# Patient Record
Sex: Female | Born: 1940 | Race: White | Hispanic: No | Marital: Married | Smoking: Former smoker
Health system: Southern US, Community
[De-identification: ages and names within clinical notes are randomized; demographics above are authoritative.]

## PROBLEM LIST (undated history)

## (undated) DIAGNOSIS — Z803 Family history of malignant neoplasm of breast: Secondary | ICD-10-CM

## (undated) DIAGNOSIS — F419 Anxiety disorder, unspecified: Secondary | ICD-10-CM

## (undated) DIAGNOSIS — Z923 Personal history of irradiation: Secondary | ICD-10-CM

## (undated) DIAGNOSIS — R195 Other fecal abnormalities: Secondary | ICD-10-CM

## (undated) DIAGNOSIS — I10 Essential (primary) hypertension: Secondary | ICD-10-CM

## (undated) DIAGNOSIS — Z800 Family history of malignant neoplasm of digestive organs: Secondary | ICD-10-CM

## (undated) DIAGNOSIS — N6320 Unspecified lump in the left breast, unspecified quadrant: Principal | ICD-10-CM

## (undated) HISTORY — PX: BREAST SURGERY: SHX581

## (undated) HISTORY — DX: Family history of malignant neoplasm of breast: Z80.3

## (undated) HISTORY — PX: EYE SURGERY: SHX253

## (undated) HISTORY — DX: Other fecal abnormalities: R19.5

## (undated) HISTORY — PX: BREAST CYST EXCISION: SHX579

## (undated) HISTORY — DX: Essential (primary) hypertension: I10

## (undated) HISTORY — DX: Unspecified lump in the left breast, unspecified quadrant: N63.20

## (undated) HISTORY — PX: BREAST EXCISIONAL BIOPSY: SUR124

## (undated) HISTORY — PX: CATARACT EXTRACTION, BILATERAL: SHX1313

## (undated) HISTORY — PX: TONSILLECTOMY: SUR1361

## (undated) HISTORY — DX: Family history of malignant neoplasm of digestive organs: Z80.0

## (undated) HISTORY — PX: MASTECTOMY: SHX3

## (undated) HISTORY — PX: BREAST LUMPECTOMY: SHX2

---

## 2001-10-15 ENCOUNTER — Other Ambulatory Visit: Admission: RE | Admit: 2001-10-15 | Discharge: 2001-10-15 | Payer: Self-pay | Admitting: Obstetrics and Gynecology

## 2002-01-07 ENCOUNTER — Ambulatory Visit (HOSPITAL_COMMUNITY): Admission: RE | Admit: 2002-01-07 | Discharge: 2002-01-07 | Payer: Self-pay | Admitting: Obstetrics and Gynecology

## 2002-01-07 ENCOUNTER — Encounter: Payer: Self-pay | Admitting: Obstetrics and Gynecology

## 2002-10-31 ENCOUNTER — Ambulatory Visit (HOSPITAL_COMMUNITY): Admission: RE | Admit: 2002-10-31 | Discharge: 2002-10-31 | Payer: Self-pay | Admitting: Obstetrics & Gynecology

## 2002-10-31 ENCOUNTER — Encounter: Payer: Self-pay | Admitting: Obstetrics & Gynecology

## 2003-01-08 ENCOUNTER — Ambulatory Visit (HOSPITAL_COMMUNITY): Admission: RE | Admit: 2003-01-08 | Discharge: 2003-01-08 | Payer: Self-pay | Admitting: Obstetrics & Gynecology

## 2003-01-08 ENCOUNTER — Encounter: Payer: Self-pay | Admitting: Obstetrics & Gynecology

## 2004-01-15 ENCOUNTER — Ambulatory Visit (HOSPITAL_COMMUNITY): Admission: RE | Admit: 2004-01-15 | Discharge: 2004-01-15 | Payer: Self-pay | Admitting: Obstetrics & Gynecology

## 2004-09-21 ENCOUNTER — Ambulatory Visit: Payer: Self-pay | Admitting: Internal Medicine

## 2004-09-21 ENCOUNTER — Ambulatory Visit (HOSPITAL_COMMUNITY): Admission: RE | Admit: 2004-09-21 | Discharge: 2004-09-21 | Payer: Self-pay | Admitting: Internal Medicine

## 2005-01-17 ENCOUNTER — Ambulatory Visit (HOSPITAL_COMMUNITY): Admission: RE | Admit: 2005-01-17 | Discharge: 2005-01-17 | Payer: Self-pay | Admitting: Obstetrics & Gynecology

## 2006-01-19 ENCOUNTER — Ambulatory Visit (HOSPITAL_COMMUNITY): Admission: RE | Admit: 2006-01-19 | Discharge: 2006-01-19 | Payer: Self-pay | Admitting: Obstetrics & Gynecology

## 2007-01-22 ENCOUNTER — Ambulatory Visit (HOSPITAL_COMMUNITY): Admission: RE | Admit: 2007-01-22 | Discharge: 2007-01-22 | Payer: Self-pay | Admitting: Obstetrics and Gynecology

## 2007-11-19 ENCOUNTER — Other Ambulatory Visit: Admission: RE | Admit: 2007-11-19 | Discharge: 2007-11-19 | Payer: Self-pay | Admitting: Obstetrics and Gynecology

## 2008-01-23 ENCOUNTER — Ambulatory Visit (HOSPITAL_COMMUNITY): Admission: RE | Admit: 2008-01-23 | Discharge: 2008-01-23 | Payer: Self-pay | Admitting: Obstetrics & Gynecology

## 2008-01-31 ENCOUNTER — Encounter: Admission: RE | Admit: 2008-01-31 | Discharge: 2008-01-31 | Payer: Self-pay | Admitting: Obstetrics & Gynecology

## 2009-02-02 ENCOUNTER — Ambulatory Visit (HOSPITAL_COMMUNITY): Admission: RE | Admit: 2009-02-02 | Discharge: 2009-02-02 | Payer: Self-pay | Admitting: Obstetrics and Gynecology

## 2009-09-11 ENCOUNTER — Ambulatory Visit (HOSPITAL_COMMUNITY): Admission: RE | Admit: 2009-09-11 | Discharge: 2009-09-11 | Payer: Self-pay | Admitting: Internal Medicine

## 2009-10-01 ENCOUNTER — Ambulatory Visit (HOSPITAL_COMMUNITY): Admission: RE | Admit: 2009-10-01 | Discharge: 2009-10-01 | Payer: Self-pay | Admitting: Internal Medicine

## 2010-02-04 ENCOUNTER — Ambulatory Visit (HOSPITAL_COMMUNITY): Admission: RE | Admit: 2010-02-04 | Discharge: 2010-02-04 | Payer: Self-pay | Admitting: Obstetrics and Gynecology

## 2010-12-03 ENCOUNTER — Other Ambulatory Visit: Payer: Self-pay | Admitting: Obstetrics & Gynecology

## 2010-12-03 DIAGNOSIS — Z1239 Encounter for other screening for malignant neoplasm of breast: Secondary | ICD-10-CM

## 2011-02-04 ENCOUNTER — Other Ambulatory Visit (HOSPITAL_COMMUNITY)
Admission: RE | Admit: 2011-02-04 | Discharge: 2011-02-04 | Disposition: A | Discharge status: Home / Self Care | Payer: Medicare Other | Source: Ambulatory Visit | Attending: Obstetrics and Gynecology | Admitting: Obstetrics and Gynecology

## 2011-02-04 ENCOUNTER — Other Ambulatory Visit: Payer: Self-pay | Admitting: Adult Health

## 2011-02-04 DIAGNOSIS — Z124 Encounter for screening for malignant neoplasm of cervix: Secondary | ICD-10-CM | POA: Insufficient documentation

## 2011-02-07 ENCOUNTER — Ambulatory Visit (HOSPITAL_COMMUNITY): Payer: Self-pay

## 2011-02-07 ENCOUNTER — Ambulatory Visit (HOSPITAL_COMMUNITY)
Admission: RE | Admit: 2011-02-07 | Discharge: 2011-02-07 | Disposition: A | Discharge status: Home / Self Care | Payer: Medicare Other | Source: Ambulatory Visit | Attending: Obstetrics & Gynecology | Admitting: Obstetrics & Gynecology

## 2011-02-07 DIAGNOSIS — Z1239 Encounter for other screening for malignant neoplasm of breast: Secondary | ICD-10-CM

## 2011-02-07 DIAGNOSIS — Z1231 Encounter for screening mammogram for malignant neoplasm of breast: Secondary | ICD-10-CM | POA: Insufficient documentation

## 2011-04-01 NOTE — Op Note (Signed)
[name], [name]                ACCOUNT NO.:  [id number]   MEDICAL RECORD NO.:  [id number]          PATIENT TYPE:  AMB   LOCATION:  DAY                           FACILITY:  [facility]   PHYSICIAN:  Lionel December, M.D.    DATE OF BIRTH:  [birth year]   DATE OF PROCEDURE:  09/21/2004  DATE OF DISCHARGE:                                 OPERATIVE REPORT   PROCEDURE:  Total colonoscopy.   INDICATIONS:  [name] is a 70 year old Caucasian female who is undergoing  screening colonoscopy.  She is average-risk for CRC.  The procedure risks  were reviewed with the patient, and informed consent was obtained.   PREMEDICATION:  Demerol 50 mg IV, Versed 8 mg IV in divided dose.   FINDINGS:  Procedure performed in endoscopy suite.  The patient's vital  signs and O2 saturation were monitored during procedure and remained stable.  The patient was placed in the left lateral recumbent position and rectal  examination performed.  No abnormality noted on external or digital exam.  Olympus video scope was placed in rectum and advanced under vision in  sigmoid colon and beyond.  Preparation was excellent except she had some  stool in the cecum, which was washed away.  Cecal landmarks, i.e.,  appendiceal orifice and ileocecal valve, were well-seen and picture taken  for the record.  A short segment of TI was also examined and was normal.  As  the scope was withdrawn, the colonic mucosa was carefully examined.  There  was a 3 mm polyp at the proximal sigmoid colon, which was ablated via cold  biopsy.  Mucosa of the rest of the colon was normal.  The rectal mucosa  similarly was normal.  The scope was retroflexed to examine anorectal  junction, and small hemorrhoids were noted below the dentate line.  The  endoscope was straightened and withdrawn.  The patient tolerated the  procedure well.   FINAL DIAGNOSES:  1.  Small polyp at proximal sigmoid colon, which was ablated via cold      biopsy.  2.  External  hemorrhoids.   RECOMMENDATIONS:  1.  Standard instructions given.  2.  I will be contacting the patient with biopsy results and further      recommendations.     [name]   NR/MEDQ  D:  09/21/2004  T:  09/21/2004  Job:  [id number]   cc:   Lazaro Arms, M.D.  [address], [address]  [place]  [place] [address]  Fax: [phone]   Cyril Mourning  Office of Dr. Despina Hidden

## 2012-01-23 ENCOUNTER — Other Ambulatory Visit: Payer: Self-pay | Admitting: Obstetrics and Gynecology

## 2012-01-23 DIAGNOSIS — Z139 Encounter for screening, unspecified: Secondary | ICD-10-CM

## 2012-02-07 DIAGNOSIS — Z1389 Encounter for screening for other disorder: Secondary | ICD-10-CM | POA: Diagnosis not present

## 2012-02-07 DIAGNOSIS — Z124 Encounter for screening for malignant neoplasm of cervix: Secondary | ICD-10-CM | POA: Diagnosis not present

## 2012-02-07 DIAGNOSIS — Z1212 Encounter for screening for malignant neoplasm of rectum: Secondary | ICD-10-CM | POA: Diagnosis not present

## 2012-02-21 ENCOUNTER — Ambulatory Visit (HOSPITAL_COMMUNITY)
Admission: RE | Admit: 2012-02-21 | Discharge: 2012-02-21 | Disposition: A | Discharge status: Home / Self Care | Payer: Medicare Other | Source: Ambulatory Visit | Attending: Obstetrics and Gynecology | Admitting: Obstetrics and Gynecology

## 2012-02-21 DIAGNOSIS — Z1231 Encounter for screening mammogram for malignant neoplasm of breast: Secondary | ICD-10-CM | POA: Insufficient documentation

## 2012-02-21 DIAGNOSIS — Z139 Encounter for screening, unspecified: Secondary | ICD-10-CM

## 2012-05-22 DIAGNOSIS — H2510 Age-related nuclear cataract, unspecified eye: Secondary | ICD-10-CM | POA: Diagnosis not present

## 2012-05-22 DIAGNOSIS — Z961 Presence of intraocular lens: Secondary | ICD-10-CM | POA: Diagnosis not present

## 2012-05-22 DIAGNOSIS — H18419 Arcus senilis, unspecified eye: Secondary | ICD-10-CM | POA: Diagnosis not present

## 2012-07-20 DIAGNOSIS — H2510 Age-related nuclear cataract, unspecified eye: Secondary | ICD-10-CM | POA: Diagnosis not present

## 2012-07-20 DIAGNOSIS — H269 Unspecified cataract: Secondary | ICD-10-CM | POA: Diagnosis not present

## 2012-08-21 DIAGNOSIS — H40019 Open angle with borderline findings, low risk, unspecified eye: Secondary | ICD-10-CM | POA: Diagnosis not present

## 2013-01-24 ENCOUNTER — Other Ambulatory Visit: Payer: Self-pay | Admitting: Obstetrics and Gynecology

## 2013-01-24 DIAGNOSIS — Z139 Encounter for screening, unspecified: Secondary | ICD-10-CM

## 2013-02-21 ENCOUNTER — Ambulatory Visit (HOSPITAL_COMMUNITY)
Admission: RE | Admit: 2013-02-21 | Discharge: 2013-02-21 | Disposition: A | Discharge status: Home / Self Care | Payer: Medicare Other | Source: Ambulatory Visit | Attending: Obstetrics and Gynecology | Admitting: Obstetrics and Gynecology

## 2013-02-21 DIAGNOSIS — Z1231 Encounter for screening mammogram for malignant neoplasm of breast: Secondary | ICD-10-CM | POA: Diagnosis not present

## 2013-02-21 DIAGNOSIS — Z139 Encounter for screening, unspecified: Secondary | ICD-10-CM

## 2013-02-22 ENCOUNTER — Other Ambulatory Visit: Payer: Self-pay | Admitting: Obstetrics and Gynecology

## 2013-02-22 DIAGNOSIS — H26499 Other secondary cataract, unspecified eye: Secondary | ICD-10-CM | POA: Diagnosis not present

## 2013-02-22 DIAGNOSIS — H18419 Arcus senilis, unspecified eye: Secondary | ICD-10-CM | POA: Diagnosis not present

## 2013-02-22 DIAGNOSIS — R928 Other abnormal and inconclusive findings on diagnostic imaging of breast: Secondary | ICD-10-CM

## 2013-02-22 DIAGNOSIS — Z961 Presence of intraocular lens: Secondary | ICD-10-CM | POA: Diagnosis not present

## 2013-02-25 ENCOUNTER — Ambulatory Visit
Admission: RE | Admit: 2013-02-25 | Discharge: 2013-02-25 | Disposition: A | Discharge status: Home / Self Care | Payer: Medicare Other | Source: Ambulatory Visit | Attending: Obstetrics and Gynecology | Admitting: Obstetrics and Gynecology

## 2013-02-25 ENCOUNTER — Other Ambulatory Visit: Payer: Self-pay | Admitting: Obstetrics and Gynecology

## 2013-02-25 DIAGNOSIS — R928 Other abnormal and inconclusive findings on diagnostic imaging of breast: Secondary | ICD-10-CM

## 2013-02-25 DIAGNOSIS — N6009 Solitary cyst of unspecified breast: Secondary | ICD-10-CM | POA: Diagnosis not present

## 2013-08-20 DIAGNOSIS — Z23 Encounter for immunization: Secondary | ICD-10-CM | POA: Diagnosis not present

## 2013-11-25 ENCOUNTER — Other Ambulatory Visit (HOSPITAL_COMMUNITY): Payer: Self-pay | Admitting: Internal Medicine

## 2013-11-25 DIAGNOSIS — R109 Unspecified abdominal pain: Secondary | ICD-10-CM | POA: Diagnosis not present

## 2013-11-25 DIAGNOSIS — R1011 Right upper quadrant pain: Secondary | ICD-10-CM

## 2013-11-26 ENCOUNTER — Ambulatory Visit (HOSPITAL_COMMUNITY)
Admission: RE | Admit: 2013-11-26 | Discharge: 2013-11-26 | Disposition: A | Discharge status: Home / Self Care | Payer: Medicare Other | Source: Ambulatory Visit | Attending: Internal Medicine | Admitting: Internal Medicine

## 2013-11-26 DIAGNOSIS — K8020 Calculus of gallbladder without cholecystitis without obstruction: Secondary | ICD-10-CM | POA: Diagnosis not present

## 2013-11-26 DIAGNOSIS — R1011 Right upper quadrant pain: Secondary | ICD-10-CM | POA: Diagnosis not present

## 2013-11-27 DIAGNOSIS — K8010 Calculus of gallbladder with chronic cholecystitis without obstruction: Secondary | ICD-10-CM | POA: Diagnosis not present

## 2013-11-27 NOTE — Patient Instructions (Signed)
[name]  11/27/2013   Your procedure is scheduled on:  11/29/2013  Report to [facility] at  63  AM.  Call this number if you have problems the morning of surgery: [phone]   Remember:   Do not eat food or drink liquids after midnight.   Take these medicines the morning of surgery with A SIP OF WATER: none   Do not wear jewelry, make-up or nail polish.  Do not wear lotions, powders, or perfumes.   Do not shave 48 hours prior to surgery. Men may shave face and neck.  Do not bring valuables to the hospital.  [facility] is not responsible for any belongings or valuables.               Contacts, dentures or bridgework may not be worn into surgery.  Leave suitcase in the car. After surgery it may be brought to your room.  For patients admitted to the hospital, discharge time is determined by your treatment team.               Patients discharged the day of surgery will not be allowed to drive home.  Name and phone number of your driver: family  Special Instructions: Shower using CHG 2 nights before surgery and the night before surgery.  If you shower the day of surgery use CHG.  Use special wash - you have one bottle of CHG for all showers.  You should use approximately 1/3 of the bottle for each shower.   Please read over the following fact sheets that you were given: Pain Booklet, Coughing and Deep Breathing, Surgical Site Infection Prevention, Anesthesia Post-op Instructions and Care and Recovery After Surgery Laparoscopic Cholecystectomy Laparoscopic cholecystectomy is surgery to remove the gallbladder. The gallbladder is located in the upper right part of the abdomen, behind the liver. It is a storage sac for bile produced in the liver. Bile aids in the digestion and absorption of fats. Cholecystectomy is often done for inflammation of the gallbladder (cholecystitis). This condition is usually caused by a buildup of gallstones (cholelithiasis) in your gallbladder.  Gallstones can block the flow of bile, resulting in inflammation and pain. In severe cases, emergency surgery may be required. When emergency surgery is not required, you will have time to prepare for the procedure. Laparoscopic surgery is an alternative to open surgery. Laparoscopic surgery has a shorter recovery time. Your common bile duct may also need to be examined during the procedure. If stones are found in the common bile duct, they may be removed. LET [facility] CARE PROVIDER KNOW ABOUT:  Any allergies you have.  All medicines you are taking, including vitamins, herbs, eye drops, creams, and over-the-counter medicines.  Previous problems you or members of your family have had with the use of anesthetics.  Any blood disorders you have.  Previous surgeries you have had.  Medical conditions you have. RISKS AND COMPLICATIONS Generally, this is a safe procedure. However, as with any procedure, complications can occur. Possible complications include:  Infection.  Damage to the common bile duct, nerves, arteries, veins, or other internal organs such as the stomach, liver, or intestines.  Bleeding.  A stone may remain in the common bile duct.  A bile leak from the cyst duct that is clipped when your gallbladder is removed.  The need to convert to open surgery, which requires a larger incision in the abdomen. This may be necessary if your surgeon thinks it is not  safe to continue with a laparoscopic procedure. BEFORE THE PROCEDURE  Ask your health care provider about changing or stopping any regular medicines. You will need to stop taking aspirin or blood thinners at least 5 days prior to surgery.  Do not eat or drink anything after midnight the night before surgery.  Let your health care provider know if you develop a cold or other infectious problem before surgery. PROCEDURE   You will be given medicine to make you sleep through the procedure (general anesthetic). A breathing  tube will be placed in your mouth.  When you are asleep, your surgeon will make several small cuts (incisions) in your abdomen.  A thin, lighted tube with a tiny camera on the end (laparoscope) is inserted through one of the small incisions. The camera on the laparoscope sends a picture to a TV screen in the operating room. This gives the surgeon a good view inside your abdomen.  A gas will be pumped into your abdomen. This expands your abdomen so that the surgeon has more room to perform the surgery.  Other tools needed for the procedure are inserted through the other incisions. The gallbladder is removed through one of the incisions.  After the removal of your gallbladder, the incisions will be closed with stitches, staples, or skin glue. AFTER THE PROCEDURE  You will be taken to a recovery area where your progress will be checked often.  You may be allowed to go home the same day if your pain is controlled and you can tolerate liquids. Document Released: 10/31/2005 Document Revised: 08/21/2013 Document Reviewed: 06/12/2013 [facility] Patient Information 2014 [place]. PATIENT INSTRUCTIONS POST-ANESTHESIA  IMMEDIATELY FOLLOWING SURGERY:  Do not drive or operate machinery for the first twenty four hours after surgery.  Do not make any important decisions for twenty four hours after surgery or while taking narcotic pain medications or sedatives.  If you develop intractable nausea and vomiting or a severe headache please notify your doctor immediately.  FOLLOW-UP:  Please make an appointment with your surgeon as instructed. You do not need to follow up with anesthesia unless specifically instructed to do so.  WOUND CARE INSTRUCTIONS (if applicable):  Keep a dry clean dressing on the anesthesia/puncture wound site if there is drainage.  Once the wound has quit draining you may leave it open to air.  Generally you should leave the bandage intact for twenty four hours unless there is  drainage.  If the epidural site drains for more than 36-48 hours please call the anesthesia department.  QUESTIONS?:  Please feel free to call your physician or the hospital operator if you have any questions, and they will be happy to assist you.

## 2013-11-28 ENCOUNTER — Encounter (HOSPITAL_COMMUNITY)
Admission: RE | Admit: 2013-11-28 | Discharge: 2013-11-28 | Disposition: A | Discharge status: Home / Self Care | Payer: Medicare Other | Source: Ambulatory Visit | Attending: General Surgery | Admitting: General Surgery

## 2013-11-28 ENCOUNTER — Encounter (HOSPITAL_COMMUNITY): Payer: Self-pay

## 2013-11-28 ENCOUNTER — Encounter (HOSPITAL_COMMUNITY): Payer: Self-pay | Admitting: Pharmacy Technician

## 2013-11-28 ENCOUNTER — Other Ambulatory Visit: Payer: Self-pay

## 2013-11-28 DIAGNOSIS — K8010 Calculus of gallbladder with chronic cholecystitis without obstruction: Secondary | ICD-10-CM | POA: Diagnosis not present

## 2013-11-28 DIAGNOSIS — Z01812 Encounter for preprocedural laboratory examination: Secondary | ICD-10-CM | POA: Diagnosis not present

## 2013-11-28 DIAGNOSIS — E663 Overweight: Secondary | ICD-10-CM | POA: Diagnosis not present

## 2013-11-28 DIAGNOSIS — Z87891 Personal history of nicotine dependence: Secondary | ICD-10-CM | POA: Diagnosis not present

## 2013-11-28 DIAGNOSIS — Z803 Family history of malignant neoplasm of breast: Secondary | ICD-10-CM | POA: Diagnosis not present

## 2013-11-28 LAB — CBC WITH DIFFERENTIAL/PLATELET
BASOS PCT: 0 % (ref 0–1)
Basophils Absolute: 0 10*3/uL (ref 0.0–0.1)
EOS PCT: 1 % (ref 0–5)
Eosinophils Absolute: 0 10*3/uL (ref 0.0–0.7)
HCT: 40.2 % (ref 36.0–46.0)
HEMOGLOBIN: 14 g/dL (ref 12.0–15.0)
Lymphocytes Relative: 38 % (ref 12–46)
Lymphs Abs: 2.5 10*3/uL (ref 0.7–4.0)
MCH: 31.5 pg (ref 26.0–34.0)
MCHC: 34.8 g/dL (ref 30.0–36.0)
MCV: 90.5 fL (ref 78.0–100.0)
MONOS PCT: 7 % (ref 3–12)
Monocytes Absolute: 0.5 10*3/uL (ref 0.1–1.0)
NEUTROS PCT: 54 % (ref 43–77)
Neutro Abs: 3.6 10*3/uL (ref 1.7–7.7)
PLATELETS: 251 10*3/uL (ref 150–400)
RBC: 4.44 MIL/uL (ref 3.87–5.11)
RDW: 12.8 % (ref 11.5–15.5)
WBC: 6.6 10*3/uL (ref 4.0–10.5)

## 2013-11-28 LAB — HEPATIC FUNCTION PANEL
ALBUMIN: 4.3 g/dL (ref 3.5–5.2)
ALT: 17 U/L (ref 0–35)
AST: 15 U/L (ref 0–37)
Alkaline Phosphatase: 72 U/L (ref 39–117)
BILIRUBIN TOTAL: 0.5 mg/dL (ref 0.3–1.2)
Bilirubin, Direct: <0.2 mg/dL (ref 0.0–0.3)
TOTAL PROTEIN: 7.2 g/dL (ref 6.0–8.3)

## 2013-11-28 LAB — AMYLASE: Amylase: 88 U/L (ref 0–105)

## 2013-11-28 LAB — BASIC METABOLIC PANEL
BUN: 14 mg/dL (ref 6–23)
CALCIUM: 9.8 mg/dL (ref 8.4–10.5)
CHLORIDE: 106 meq/L (ref 96–112)
CO2: 23 mEq/L (ref 19–32)
CREATININE: 0.72 mg/dL (ref 0.50–1.10)
GFR calc Af Amer: >90 mL/min (ref >90)
GFR calc non Af Amer: 84 mL/min — ABNORMAL LOW (ref >90)
GLUCOSE: 91 mg/dL (ref 70–99)
Potassium: 3.7 mEq/L (ref 3.7–5.3)
SODIUM: 145 meq/L (ref 137–147)

## 2013-11-28 NOTE — Consult Note (Signed)
NAMECARREN, BLAKLEY NO.:  [id number]  MEDICAL RECORD NO.:  [id number]  LOCATION:                                 FACILITY:  PHYSICIAN:  Felicie Morn, M.D. DATE OF BIRTH:  1941-03-26  DATE OF CONSULTATION:  11/27/2013 DATE OF DISCHARGE:                                CONSULTATION   This is a 73 year old white female who had a 2-week history of right upper quadrant postprandial pain radiating to her back.  She had no vomiting.  Small amount of nausea.  She saw Dr. Delphina Cahill and sonogram was done and did show that she had a centimeter diameter stone within her common bile duct.  The common bile duct was not dilated.  There were signs of pericholecystic inflammation.  She was self-referred to my office.  PHYSICAL EXAMINATION:  VITAL SIGNS:  She is 5 feet 3-1/2 inches tall, weighs 168 pounds.  Temperature is 98.7, pulse rate 72 and regular, respirations 12, blood pressure 130/80. HEENT:  Head is normocephalic.  Eyes, extraocular movements are intact. Pupils are round and reactive to light and accommodation.  There is no icteric tincture.  Nose, oral mucosa are moist.  There are no bruits appreciated.  No adenopathy.  No thyromegaly. CHEST:  Clear both anterior and posterior auscultation. HEART:  Regular rhythm. BREASTS AND AXILLAE:  Without masses. ABDOMEN:  Soft.  She is softly tender in the right upper quadrant.  No masses and/or visceromegaly are appreciated. RECTAL:  Deferred. EXTREMITIES:  Within normal limits.  REVIEW OF SYSTEMS:  NEURO SYSTEM:  No lateralizing neurological findings.  No history of migraines or seizures.  ENDOCRINE:  No history of diabetes, thyroid disease, or adrenal problems.  CARDIOPULMONARY SYSTEM:  The patient is a nondrinker and nonsmoker.  MUSCULOSKELETAL SYSTEM:  She is somewhat overweight.  OB/GYN:  She is a nulliparous female.  She has sisters who had carcinoma of the breast.  Her mammogram done in April 2014 was  negative and her breast examination in the office was negative and her axilla was without masses.  GI SYSTEM:  No history of hepatitis, constipation, diarrhea, bright red rectal bleeding, melena, or history of inflammatory bowel disease or irritable bowel syndrome.  No unexplained weight loss.  Last colonoscopy was in 2005 where she had a polypectomy.  Followup arrangements have been made for that.  GU SYSTEM:  No history of frequency, dysuria, or nephrolithiasis.  PAST SURGERY:  She had a breast biopsy approximately 10 years ago.  This was for benign disease.  She takes no medications on a regular basis.  She has no known allergies.  REVIEW OF HISTORY AND PHYSICAL:  Therefore, Ms. Johanning is a 73 year old white female who has cholecystitis secondary to cholelithiasis.  I am checking her liver function studies and amylase prior to elective surgery.  We discussed complications not limited to, but including bleeding, infection, damage to bile ducts, perforation of organs, transitory diarrhea, and the possibility that open surgery might be required.  Informed consent was obtained.  We have planned for surgery on the 16th of January.     Felicie Morn, M.D.     WB/MEDQ  D:  11/27/2013  T:  11/28/2013  Job:  [id number]

## 2013-11-29 ENCOUNTER — Encounter (HOSPITAL_COMMUNITY)
Admission: RE | Disposition: A | Discharge status: Home / Self Care | Payer: Self-pay | Source: Ambulatory Visit | Attending: General Surgery

## 2013-11-29 ENCOUNTER — Encounter (HOSPITAL_COMMUNITY): Payer: Self-pay | Admitting: *Deleted

## 2013-11-29 ENCOUNTER — Encounter (HOSPITAL_COMMUNITY): Payer: Medicare Other | Admitting: Anesthesiology

## 2013-11-29 ENCOUNTER — Ambulatory Visit (HOSPITAL_COMMUNITY)
Admission: RE | Admit: 2013-11-29 | Discharge: 2013-11-30 | Disposition: A | Discharge status: Home / Self Care | Payer: Medicare Other | Source: Ambulatory Visit | Attending: General Surgery | Admitting: General Surgery

## 2013-11-29 ENCOUNTER — Ambulatory Visit (HOSPITAL_COMMUNITY): Payer: Medicare Other | Admitting: Anesthesiology

## 2013-11-29 DIAGNOSIS — K8010 Calculus of gallbladder with chronic cholecystitis without obstruction: Secondary | ICD-10-CM | POA: Diagnosis not present

## 2013-11-29 DIAGNOSIS — Z01812 Encounter for preprocedural laboratory examination: Secondary | ICD-10-CM | POA: Diagnosis not present

## 2013-11-29 DIAGNOSIS — Z87891 Personal history of nicotine dependence: Secondary | ICD-10-CM | POA: Insufficient documentation

## 2013-11-29 DIAGNOSIS — Z803 Family history of malignant neoplasm of breast: Secondary | ICD-10-CM | POA: Insufficient documentation

## 2013-11-29 DIAGNOSIS — E663 Overweight: Secondary | ICD-10-CM | POA: Insufficient documentation

## 2013-11-29 DIAGNOSIS — K8020 Calculus of gallbladder without cholecystitis without obstruction: Secondary | ICD-10-CM | POA: Diagnosis not present

## 2013-11-29 HISTORY — PX: CHOLECYSTECTOMY: SHX55

## 2013-11-29 SURGERY — LAPAROSCOPIC CHOLECYSTECTOMY
Anesthesia: General | Site: Abdomen

## 2013-11-29 MED ORDER — ONDANSETRON HCL 4 MG PO TABS: 4.0000 mg | ORAL_TABLET | Freq: Four times a day (QID) | ORAL | Status: DC | PRN

## 2013-11-29 MED ORDER — SODIUM CHLORIDE 0.9 % IR SOLN
Status: DC | PRN
  Administered 2013-11-29: 3000 mL

## 2013-11-29 MED ORDER — ACETAMINOPHEN 325 MG PO TABS
650.0000 mg | ORAL_TABLET | ORAL | Status: DC | PRN
  Administered 2013-11-29 – 2013-11-30 (×2): 650 mg via ORAL
  Filled 2013-11-29 (×2): qty 2

## 2013-11-29 MED ORDER — GLYCOPYRROLATE 0.2 MG/ML IJ SOLN
INTRAMUSCULAR | Status: DC | PRN
  Administered 2013-11-29: .6 mg via INTRAVENOUS

## 2013-11-29 MED ORDER — FENTANYL CITRATE 0.05 MG/ML IJ SOLN
INTRAMUSCULAR | Status: AC
  Filled 2013-11-29: qty 2

## 2013-11-29 MED ORDER — BUPIVACAINE HCL (PF) 0.5 % IJ SOLN
INTRAMUSCULAR | Status: DC | PRN
Start: 2013-11-29 — End: 2013-11-29
  Administered 2013-11-29: 15 mL

## 2013-11-29 MED ORDER — MIDAZOLAM HCL 2 MG/2ML IJ SOLN
1.0000 mg | INTRAMUSCULAR | Status: DC | PRN
  Administered 2013-11-29 (×2): 2 mg via INTRAVENOUS
  Filled 2013-11-29: qty 2

## 2013-11-29 MED ORDER — SODIUM CHLORIDE 0.9 % IR SOLN
Status: DC | PRN
  Administered 2013-11-29: 1000 mL

## 2013-11-29 MED ORDER — NEOSTIGMINE METHYLSULFATE 1 MG/ML IJ SOLN
INTRAMUSCULAR | Status: DC | PRN
  Administered 2013-11-29: 3 mg via INTRAVENOUS

## 2013-11-29 MED ORDER — LACTATED RINGERS IV SOLN
INTRAVENOUS | Status: DC
Start: 2013-11-29 — End: 2013-11-29
  Administered 2013-11-29 (×2): via INTRAVENOUS

## 2013-11-29 MED ORDER — MIDAZOLAM HCL 5 MG/5ML IJ SOLN
INTRAMUSCULAR | Status: DC | PRN
  Administered 2013-11-29: 2 mg via INTRAVENOUS

## 2013-11-29 MED ORDER — DEXAMETHASONE SODIUM PHOSPHATE 4 MG/ML IJ SOLN
4.0000 mg | Freq: Once | INTRAMUSCULAR | Status: AC
  Administered 2013-11-29: 4 mg via INTRAVENOUS

## 2013-11-29 MED ORDER — LIDOCAINE HCL (PF) 1 % IJ SOLN
INTRAMUSCULAR | Status: AC
  Filled 2013-11-29: qty 5

## 2013-11-29 MED ORDER — ONDANSETRON HCL 4 MG/2ML IJ SOLN
4.0000 mg | Freq: Once | INTRAMUSCULAR | Status: AC
  Administered 2013-11-29: 4 mg via INTRAVENOUS

## 2013-11-29 MED ORDER — PROPOFOL 10 MG/ML IV BOLUS
INTRAVENOUS | Status: AC
  Filled 2013-11-29: qty 20

## 2013-11-29 MED ORDER — BUPIVACAINE HCL (PF) 0.5 % IJ SOLN
INTRAMUSCULAR | Status: AC
  Filled 2013-11-29: qty 30

## 2013-11-29 MED ORDER — GLYCOPYRROLATE 0.2 MG/ML IJ SOLN
INTRAMUSCULAR | Status: AC
  Filled 2013-11-29: qty 1

## 2013-11-29 MED ORDER — CEFAZOLIN SODIUM-DEXTROSE 2-3 GM-% IV SOLR
2.0000 g | Freq: Once | INTRAVENOUS | Status: AC
  Administered 2013-11-29: 2 g via INTRAVENOUS

## 2013-11-29 MED ORDER — MIDAZOLAM HCL 2 MG/2ML IJ SOLN
INTRAMUSCULAR | Status: AC
  Filled 2013-11-29: qty 2

## 2013-11-29 MED ORDER — POTASSIUM CHLORIDE IN NACL 20-0.9 MEQ/L-% IV SOLN
INTRAVENOUS | Status: DC
  Administered 2013-11-29 – 2013-11-30 (×2): via INTRAVENOUS

## 2013-11-29 MED ORDER — FENTANYL CITRATE 0.05 MG/ML IJ SOLN
INTRAMUSCULAR | Status: DC | PRN
  Administered 2013-11-29 (×6): 50 ug via INTRAVENOUS

## 2013-11-29 MED ORDER — FENTANYL CITRATE 0.05 MG/ML IJ SOLN: 25.0000 ug | INTRAMUSCULAR | Status: DC | PRN

## 2013-11-29 MED ORDER — ONDANSETRON HCL 4 MG/2ML IJ SOLN
INTRAMUSCULAR | Status: AC
  Filled 2013-11-29: qty 2

## 2013-11-29 MED ORDER — WATER FOR IRRIGATION, STERILE IR SOLN
Status: DC | PRN
  Administered 2013-11-29 (×2): 1000 mL

## 2013-11-29 MED ORDER — LIDOCAINE HCL 1 % IJ SOLN
INTRAMUSCULAR | Status: DC | PRN
  Administered 2013-11-29: 40 mg via INTRADERMAL

## 2013-11-29 MED ORDER — ONDANSETRON HCL 4 MG/2ML IJ SOLN: 4.0000 mg | Freq: Once | INTRAMUSCULAR | Status: DC | PRN

## 2013-11-29 MED ORDER — MORPHINE SULFATE 2 MG/ML IJ SOLN
1.0000 mg | INTRAMUSCULAR | Status: DC | PRN
  Administered 2013-11-29 – 2013-11-30 (×3): 1 mg via INTRAVENOUS
  Filled 2013-11-29 (×3): qty 1

## 2013-11-29 MED ORDER — ONDANSETRON HCL 4 MG/2ML IJ SOLN
4.0000 mg | Freq: Four times a day (QID) | INTRAMUSCULAR | Status: DC | PRN
Start: 2013-11-29 — End: 2013-11-30

## 2013-11-29 MED ORDER — ROCURONIUM BROMIDE 50 MG/5ML IV SOLN
INTRAVENOUS | Status: AC
  Filled 2013-11-29: qty 1

## 2013-11-29 MED ORDER — FENTANYL CITRATE 0.05 MG/ML IJ SOLN
INTRAMUSCULAR | Status: AC
  Filled 2013-11-29: qty 5

## 2013-11-29 MED ORDER — DEXAMETHASONE SODIUM PHOSPHATE 4 MG/ML IJ SOLN
INTRAMUSCULAR | Status: AC
  Filled 2013-11-29: qty 1

## 2013-11-29 MED ORDER — PROPOFOL 10 MG/ML IV BOLUS
INTRAVENOUS | Status: DC | PRN
  Administered 2013-11-29: 130 mg via INTRAVENOUS

## 2013-11-29 MED ORDER — HEMOSTATIC AGENTS (NO CHARGE) OPTIME
TOPICAL | Status: DC | PRN
  Administered 2013-11-29: 1 via TOPICAL

## 2013-11-29 MED ORDER — ROCURONIUM BROMIDE 100 MG/10ML IV SOLN
INTRAVENOUS | Status: DC | PRN
  Administered 2013-11-29: 35 mg via INTRAVENOUS

## 2013-11-29 MED ORDER — CEFAZOLIN SODIUM-DEXTROSE 2-3 GM-% IV SOLR
INTRAVENOUS | Status: AC
  Filled 2013-11-29: qty 50

## 2013-11-29 SURGICAL SUPPLY — 69 items
APPLICATOR COTTON TIP 6IN STRL (MISCELLANEOUS) ×3 IMPLANT
APPLIER CLIP LAPSCP 10X32 DD (CLIP) ×3 IMPLANT
ATTRACTOMAT 16X20 MAGNETIC DRP (DRAPES) IMPLANT
BAG HAMPER (MISCELLANEOUS) ×3 IMPLANT
BLADE SURG 15 STRL LF DISP TIS (BLADE) ×1 IMPLANT
BLADE SURG 15 STRL SS (BLADE) ×2
BLADE SURG SZ10 CARB STEEL (BLADE) IMPLANT
CLOTH BEACON ORANGE TIMEOUT ST (SAFETY) ×3 IMPLANT
COVER LIGHT HANDLE STERIS (MISCELLANEOUS) ×6 IMPLANT
DECANTER SPIKE VIAL GLASS SM (MISCELLANEOUS) ×3 IMPLANT
DISSECTOR BLUNT TIP ENDO 5MM (MISCELLANEOUS) ×3 IMPLANT
DRAPE WARM FLUID 44X44 (DRAPE) IMPLANT
DRESSING OPSITE X SMALL 2X3 (GAUZE/BANDAGES/DRESSINGS) ×9 IMPLANT
DRSG TEGADERM 2-3/8X2-3/4 SM (GAUZE/BANDAGES/DRESSINGS) ×9 IMPLANT
ELECT BLADE 6 FLAT ULTRCLN (ELECTRODE) IMPLANT
ELECT REM PT RETURN 9FT ADLT (ELECTROSURGICAL) ×3
ELECTRODE REM PT RTRN 9FT ADLT (ELECTROSURGICAL) ×1 IMPLANT
EVACUATOR DRAINAGE 10X20 100CC (DRAIN) ×1 IMPLANT
EVACUATOR SILICONE 100CC (DRAIN) ×2
FILTER SMOKE EVAC LAPAROSHD (FILTER) ×3 IMPLANT
FORMALIN 10 PREFIL 120ML (MISCELLANEOUS) ×3 IMPLANT
GLOVE BIOGEL PI IND STRL 7.0 (GLOVE) ×2 IMPLANT
GLOVE BIOGEL PI INDICATOR 7.0 (GLOVE) ×4
GLOVE ECLIPSE 6.5 STRL STRAW (GLOVE) ×3 IMPLANT
GLOVE SKINSENSE NS SZ7.0 (GLOVE) ×2
GLOVE SKINSENSE STRL SZ7.0 (GLOVE) ×1 IMPLANT
GLOVE SS BIOGEL STRL SZ 6.5 (GLOVE) ×1 IMPLANT
GLOVE SUPERSENSE BIOGEL SZ 6.5 (GLOVE) ×2
GOWN STRL REUS W/TWL LRG LVL3 (GOWN DISPOSABLE) ×9 IMPLANT
HEMOSTAT SURGICEL 4X8 (HEMOSTASIS) ×3 IMPLANT
INST SET LAPROSCOPIC AP (KITS) ×3 IMPLANT
IV NS IRRIG 3000ML ARTHROMATIC (IV SOLUTION) ×3 IMPLANT
KIT ROOM TURNOVER APOR (KITS) ×3 IMPLANT
MANIFOLD NEPTUNE II (INSTRUMENTS) ×3 IMPLANT
NEEDLE INSUFFLATION 14GA 120MM (NEEDLE) ×3 IMPLANT
NS IRRIG 1000ML POUR BTL (IV SOLUTION) ×3 IMPLANT
PACK LAP CHOLE LZT030E (CUSTOM PROCEDURE TRAY) ×3 IMPLANT
PAD ARMBOARD 7.5X6 YLW CONV (MISCELLANEOUS) ×3 IMPLANT
PENCIL HANDSWITCHING (ELECTRODE) IMPLANT
POUCH SPECIMEN RETRIEVAL 10MM (ENDOMECHANICALS) ×3 IMPLANT
SET BASIN LINEN APH (SET/KITS/TRAYS/PACK) ×3 IMPLANT
SET TUBE IRRIG SUCTION NO TIP (IRRIGATION / IRRIGATOR) ×3 IMPLANT
SLEEVE ENDOPATH XCEL 5M (ENDOMECHANICALS) ×3 IMPLANT
SOL PREP PROV IODINE SCRUB 4OZ (MISCELLANEOUS) ×3 IMPLANT
SPONGE DRAIN TRACH 4X4 STRL 2S (GAUZE/BANDAGES/DRESSINGS) ×3 IMPLANT
SPONGE GAUZE 4X4 12PLY (GAUZE/BANDAGES/DRESSINGS) ×3 IMPLANT
SPONGE INTESTINAL PEANUT (DISPOSABLE) IMPLANT
SPONGE LAP 18X18 X RAY DECT (DISPOSABLE) IMPLANT
STAPLER VISISTAT 35W (STAPLE) ×3 IMPLANT
SUT ETHILON 3 0 FSL (SUTURE) ×3 IMPLANT
SUT SILK 2 0 (SUTURE)
SUT SILK 2 0 SH (SUTURE) IMPLANT
SUT SILK 2-0 18XBRD TIE 12 (SUTURE) IMPLANT
SUT SILK 3 0 SH CR/8 (SUTURE) IMPLANT
SUT VIC AB 0 CT1 27 (SUTURE)
SUT VIC AB 0 CT1 27XBRD ANTBC (SUTURE) IMPLANT
SUT VIC AB 0 CT1 27XCR 8 STRN (SUTURE) IMPLANT
SUT VICRYL 0 UR6 27IN ABS (SUTURE) ×6 IMPLANT
SYR BULB IRRIGATION 50ML (SYRINGE) IMPLANT
TAPE CLOTH SURG 4X10 WHT LF (GAUZE/BANDAGES/DRESSINGS) ×3 IMPLANT
TOWEL OR 17X26 4PK STRL BLUE (TOWEL DISPOSABLE) ×3 IMPLANT
TRAY FOLEY CATH 16FR SILVER (SET/KITS/TRAYS/PACK) ×3 IMPLANT
TROCAR ENDO BLADELESS 11MM (ENDOMECHANICALS) ×3 IMPLANT
TROCAR XCEL NON-BLD 5MMX100MML (ENDOMECHANICALS) ×3 IMPLANT
TROCAR XCEL UNIV SLVE 11M 100M (ENDOMECHANICALS) ×3 IMPLANT
TUBING INSUFFLATION HIGH FLOW (TUBING) ×3 IMPLANT
WARMER LAPAROSCOPE (MISCELLANEOUS) ×3 IMPLANT
WATER STERILE IRR 1000ML POUR (IV SOLUTION) ×6 IMPLANT
YANKAUER SUCT BULB TIP 10FT TU (MISCELLANEOUS) IMPLANT

## 2013-11-29 NOTE — Progress Notes (Signed)
Post OP Check  Filed Vitals:   11/29/13 1355  BP: 142/82  Pulse:   Temp: 97.3 F (36.3 C)  Resp:   Wound clean and dry.  Minimal serosanguinous drainage from JP.  Doing well with expected "gas pains" and mild incisional discomfort.  Doing well post op.

## 2013-11-29 NOTE — Anesthesia Postprocedure Evaluation (Signed)
  Anesthesia Post-op Note  Patient: [name]  Procedure(s) Performed: Procedure(s): LAPAROSCOPIC CHOLECYSTECTOMY (N/A)  Patient Location: PACU  Anesthesia Type:General  Level of Consciousness: awake and alert   Airway and Oxygen Therapy: Patient Spontanous Breathing and Patient connected to face mask oxygen  Post-op Pain: mild  Post-op Assessment: Post-op Vital signs reviewed, Patient's Cardiovascular Status Stable, Respiratory Function Stable, Patent Airway and No signs of Nausea or vomiting  Post-op Vital Signs: Reviewed and stable  Complications: No apparent anesthesia complications

## 2013-11-29 NOTE — Anesthesia Preprocedure Evaluation (Signed)
Anesthesia Evaluation  Patient identified by MRN, date of birth, ID band Patient awake    Reviewed: Allergy & Precautions, H&P , NPO status , Patient's Chart, lab work & pertinent test results  Airway Mallampati: II TM Distance: >3 FB Neck ROM: Full    Dental  (+) Teeth Intact   Pulmonary neg pulmonary ROS, former smoker,  breath sounds clear to auscultation        Cardiovascular negative cardio ROS  Rhythm:Regular Rate:Normal     Neuro/Psych    GI/Hepatic negative GI ROS,   Endo/Other    Renal/GU      Musculoskeletal   Abdominal   Peds  Hematology   Anesthesia Other Findings   Reproductive/Obstetrics                           Anesthesia Physical Anesthesia Plan  ASA: II  Anesthesia Plan: General   Post-op Pain Management:    Induction: Intravenous  Airway Management Planned: Oral ETT  Additional Equipment:   Intra-op Plan:   Post-operative Plan: Extubation in OR  Informed Consent: I have reviewed the patients History and Physical, chart, labs and discussed the procedure including the risks, benefits and alternatives for the proposed anesthesia with the patient or authorized representative who has indicated his/her understanding and acceptance.     Plan Discussed with:   Anesthesia Plan Comments:         Anesthesia Quick Evaluation

## 2013-11-29 NOTE — Progress Notes (Signed)
UR chart review completed.  

## 2013-11-29 NOTE — Brief Op Note (Signed)
11/29/2013  12:06 PM  PATIENT:  [name]  73 y.o. female  PRE-OPERATIVE DIAGNOSIS:  Cholecystitis secondary to Cholelithiasis  POST-OPERATIVE DIAGNOSIS:  Cholecystitis secondary to Cholelithiasis  PROCEDURE:  Procedure(s): LAPAROSCOPIC CHOLECYSTECTOMY (N/A)  SURGEON:  Surgeon(s) and Role:    * Scherry Ran, MD - Primary  PHYSICIAN ASSISTANT:   ASSISTANTS: none   ANESTHESIA:   general  EBL:  Total I/O In: 1000 [I.V.:1000] Out: 48 [Urine:50; Blood:30]  BLOOD ADMINISTERED:none  DRAINS: Penrose drain in the in liver bed.   LOCAL MEDICATIONS USED:  MARCAINE  0.5% ~ 15 cc.  SPECIMEN:  Source of Specimen:  gall bladder and stone.  DISPOSITION OF SPECIMEN:  PATHOLOGY  COUNTS:  YES  TOURNIQUET:  * No tourniquets in log *  DICTATION: .Other Dictation: Dictation Number OR dict. # [id number].  PLAN OF CARE: Admit for overnight observation  PATIENT DISPOSITION:  PACU - hemodynamically stable.   Delay start of Pharmacological VTE agent (>24hrs) due to surgical blood loss or risk of bleeding: not applicable

## 2013-11-29 NOTE — Anesthesia Procedure Notes (Signed)
Procedure Name: Intubation Date/Time: 11/29/2013 10:43 AM Performed by: Tressie Stalker E Pre-anesthesia Checklist: Patient identified, Patient being monitored, Timeout performed, Emergency Drugs available and Suction available Patient Re-evaluated:Patient Re-evaluated prior to inductionOxygen Delivery Method: Circle System Utilized Preoxygenation: Pre-oxygenation with 100% oxygen Intubation Type: IV induction Ventilation: Mask ventilation without difficulty Laryngoscope Size: Mac and 3 Grade View: Grade I Tube type: Oral Tube size: 7.0 mm Number of attempts: 1 Airway Equipment and Method: stylet Placement Confirmation: ETT inserted through vocal cords under direct vision,  positive ETCO2 and breath sounds checked- equal and bilateral Secured at: 21 cm Tube secured with: Tape Dental Injury: Teeth and Oropharynx as per pre-operative assessment

## 2013-11-29 NOTE — Progress Notes (Signed)
88 yr. Old W. Female for semi elective Cholecystectomy for cholelithiasis.  Labs reviewed and sonogram reviewed.  Abdom is softly tender but pt afebrile and without bump in WBC or LFS. Procedure and risks explained and informed consent obtained.  No clinical change in H&P. Dict. # [id number].  Filed Vitals:   11/29/13 1025  BP: 118/72  Temp:   Resp: 24  temp 97.7, HR 63/min, O2 sat 98%.

## 2013-11-29 NOTE — Transfer of Care (Signed)
Immediate Anesthesia Transfer of Care Note  Patient: [name]  Procedure(s) Performed: Procedure(s): LAPAROSCOPIC CHOLECYSTECTOMY (N/A)  Patient Location: PACU  Anesthesia Type:General  Level of Consciousness: awake, alert  and oriented  Airway & Oxygen Therapy: Patient Spontanous Breathing and Patient connected to face mask oxygen  Post-op Assessment: Report given to PACU RN  Post vital signs: Reviewed and stable  Complications: No apparent anesthesia complications

## 2013-11-30 LAB — BASIC METABOLIC PANEL
BUN: 8 mg/dL (ref 6–23)
CHLORIDE: 106 meq/L (ref 96–112)
CO2: 24 meq/L (ref 19–32)
CREATININE: 0.68 mg/dL (ref 0.50–1.10)
Calcium: 8.7 mg/dL (ref 8.4–10.5)
GFR calc non Af Amer: 86 mL/min — ABNORMAL LOW (ref >90)
Glucose, Bld: 96 mg/dL (ref 70–99)
POTASSIUM: 3.8 meq/L (ref 3.7–5.3)
Sodium: 142 mEq/L (ref 137–147)

## 2013-11-30 LAB — HEPATIC FUNCTION PANEL
ALK PHOS: 59 U/L (ref 39–117)
ALT: 41 U/L — AB (ref 0–35)
AST: 38 U/L — AB (ref 0–37)
Albumin: 3.3 g/dL — ABNORMAL LOW (ref 3.5–5.2)
Bilirubin, Direct: <0.2 mg/dL (ref 0.0–0.3)
Total Bilirubin: 0.5 mg/dL (ref 0.3–1.2)
Total Protein: 6 g/dL (ref 6.0–8.3)

## 2013-11-30 LAB — CBC
HCT: 35.6 % — ABNORMAL LOW (ref 36.0–46.0)
Hemoglobin: 12.4 g/dL (ref 12.0–15.0)
MCH: 31.7 pg (ref 26.0–34.0)
MCHC: 34.8 g/dL (ref 30.0–36.0)
MCV: 91 fL (ref 78.0–100.0)
PLATELETS: 202 10*3/uL (ref 150–400)
RBC: 3.91 MIL/uL (ref 3.87–5.11)
RDW: 13.1 % (ref 11.5–15.5)
WBC: 10.3 10*3/uL (ref 4.0–10.5)

## 2013-11-30 MED ORDER — OXYCODONE-ACETAMINOPHEN 10-325 MG PO TABS: 1.0000 | ORAL_TABLET | ORAL | Status: DC | PRN

## 2013-11-30 NOTE — Progress Notes (Signed)
Patient states understanding of discharge instructions, prescription given. 

## 2013-11-30 NOTE — Progress Notes (Signed)
POD # 1  Filed Vitals:   11/30/13 0617  BP: 139/57  Pulse: 55  Temp: 98.4 F (36.9 C)  Resp: 20    Doing well post op.  Less incisional pain.  Serosanguinous  JP drainage only.  Wounds clean and dry and abdomen soft.  Dressings changed and JP removed.  Post op labs reviewed and they are compatible with post op hydration.  No bump in bilirubin.  Pt voiding well and without dysuria.  Discharge and follow up arranged.  D/C dict. # [id number].

## 2013-11-30 NOTE — Discharge Summary (Signed)
NAMECHESSIE, [name] NO.:  [id number]  MEDICAL RECORD NO.:  [id number]  LOCATION:  I627                          FACILITY:  [facility]  PHYSICIAN:  Felicie Morn, M.D. DATE OF BIRTH:  August 29, 1941  DATE OF ADMISSION:  11/29/2013 DATE OF DISCHARGE:  01/17/2015LH                              DISCHARGE SUMMARY   PROCEDURE:  Laparoscopic cholecystectomy on November 29, 2013.  DIAGNOSES:  Cholecystitis, cholelithiasis.  NOTE:  This is a 73 year old white female who had a 2-week history of postprandial right upper quadrant pain accompanied with nausea and discomfort which had increased.  She was found preoperatively to have cholelithiasis on sonography.  She was self-referred to my office for laparoscopic cholecystectomy.  We discussed procedure in detail with her and she was admitted after a diet restriction via the outpatient department.  She underwent uneventful laparoscopic surgery postoperatively.  Her wounds were clean.  She had minimal serosanguineous JP drainage.  Her laboratory data was acceptable consistent with some postoperative hydration and her bilirubin experienced no increase.  She had a mild increase of her liver function studies, compatible for cautery to the liver bed.  Clinically, her abdomen was completely soft and she was hungry and she was voiding well, and she had no leg pain, shortness of breath, dysuria, or chest pain.  Her Jackson-Pratt drain was removed, and prior to discharge and followup arrangements have been made.  I will follow her perioperatively after which I encouraged her to follow up with Dr. Delphina Cahill for followup of medical treatment.     Felicie Morn, M.D.     WB/MEDQ  D:  11/30/2013  T:  11/30/2013  Job:  [id number]  cc:   Delphina Cahill, M.D. Fax: [phone]

## 2013-11-30 NOTE — Anesthesia Postprocedure Evaluation (Signed)
  Anesthesia Post-op Note  Patient: [name]  Procedure(s) Performed: Procedure(s): LAPAROSCOPIC CHOLECYSTECTOMY (N/A)  Patient Location: room 311  Anesthesia Type:General  Level of Consciousness: awake, alert , oriented and patient cooperative  Airway and Oxygen Therapy: Patient Spontanous Breathing  Post-op Pain: 2 /10, mild  Post-op Assessment: Post-op Vital signs reviewed, Patient's Cardiovascular Status Stable, Respiratory Function Stable, Patent Airway, No signs of Nausea or vomiting, Adequate PO intake and Pain level controlled  Post-op Vital Signs: Reviewed and stable  Complications: No apparent anesthesia complications

## 2013-11-30 NOTE — Op Note (Signed)
[name], [name] NO.:  [id number]  MEDICAL RECORD NO.:  [id number]  LOCATION:  K481                          FACILITY:  [facility]  PHYSICIAN:  Felicie Morn, M.D. DATE OF BIRTH:  1941-04-22  DATE OF PROCEDURE:  11/29/2013 DATE OF DISCHARGE:                              OPERATIVE REPORT   PREOPERATIVE DIAGNOSES:  Cholecystitis and cholelithiasis.  POSTOPERATIVE DIAGNOSIS:  Cholecystitis and cholelithiasis.  PROCEDURE:  Laparoscopic cholecystectomy.  SPECIMEN:  Gallbladder and stones.  WOUND CLASSIFICATION:  Clean, contaminated.  NOTE:  This is a 73 year old white female who had approximately 2-week history of recurrent right upper quadrant postprandial pain radiating to her back.  She was found to have on sonography  cholelithiasis.  Liver function studies were not elevated and she had no obvious signs of cholecystitis on sonogram.  She was seen in my office where we discussed a semi-elective laparoscopic cholecystectomy as the patient would continue to have symptoms.  We discussed complications not limited to, but including bleeding, infection, damage to bile ducts, perforation of organs, transitory diarrhea, and the possibility that open cholecystectomy may be required and informed consent was obtained.  GROSS OPERATIVE FINDINGS:  The patient had a pea-sized stone that was likely ball valving in the cystic duct.  The cystic duct was of normal caliber.  There was some distal inflammation that was minimal. No real thickening of the gallbladder itself, although there was some fatty infiltration at Hartmann's pouch.  The rest of the right upper quadrant was grossly within normal limits.  TECHNIQUE:  The patient was placed in supine position and after the adequate administration of general anesthesia via endotracheal intubation, a Foley catheter was aseptically inserted.  She was prepped with Betadine solution and draped in the usual manner and  a periumbilical incision was then carried out over the superior aspect of the umbilicus. The fascia was then grasped with a sharp towel clip and elevated and a Veress needle was inserted and confirmed in position with a saline drop test.  We then insufflated the abdomen with approximately 3.5 L of CO2.  We then under direct vision placed 11 mm cannula in the epigastrium and two 5-mm cannulas in the right upper quadrant laterally. The gallbladder was grasped and its adhesions were taken down.  The cystic duct was clearly visualized and triply silver clipped and divided as was the cystic artery, and the gallbladder was then removed uneventfully from the liver bed using the cautery device.  There was minimal oozing. We lost less than 20 mL of blood. The gallbladder bed was irrigated.  I elected to leave some Surgicel in the liver bed as well as a Jackson-Pratt drain which exited through one of the lateral most incisions.  The abdomen was then desufflated and we closed the incision with 0 Polysorb in the area of the umbilicus and epigastrium and closed the skin with a stapling device.  The drain was sutured in place with 3-0 nylon.  I used approximately 10 or 15 mL of 0.5% Sensorcaine around the port sites for postoperative comfort.  Prior to closure; all sponge, needle, and instrument counts were found to be correct.  Estimated blood loss  was, as stated, minimal. The patient received crystalloids postoperatively and intraoperatively there were no complications.     Felicie Morn, M.D.     WB/MEDQ  D:  11/29/2013  T:  11/30/2013  Job:  [id number]  cc:   Delphina Cahill, M.D. Fax: [phone]

## 2013-12-03 ENCOUNTER — Encounter (HOSPITAL_COMMUNITY): Payer: Self-pay | Admitting: General Surgery

## 2014-01-27 ENCOUNTER — Other Ambulatory Visit (HOSPITAL_COMMUNITY): Payer: Self-pay | Admitting: Internal Medicine

## 2014-01-27 DIAGNOSIS — Z139 Encounter for screening, unspecified: Secondary | ICD-10-CM

## 2014-01-27 DIAGNOSIS — Z1231 Encounter for screening mammogram for malignant neoplasm of breast: Secondary | ICD-10-CM

## 2014-02-20 ENCOUNTER — Other Ambulatory Visit (HOSPITAL_COMMUNITY)
Admission: RE | Admit: 2014-02-20 | Discharge: 2014-02-20 | Disposition: A | Discharge status: Home / Self Care | Payer: Medicare Other | Source: Ambulatory Visit | Attending: Adult Health | Admitting: Adult Health

## 2014-02-20 ENCOUNTER — Encounter: Payer: Self-pay | Admitting: Adult Health

## 2014-02-20 ENCOUNTER — Ambulatory Visit (INDEPENDENT_AMBULATORY_CARE_PROVIDER_SITE_OTHER): Payer: Medicare Other | Admitting: Adult Health

## 2014-02-20 VITALS — BP 100/66 | HR 72 | Ht 62.0 in | Wt 168.0 lb

## 2014-02-20 DIAGNOSIS — E7800 Pure hypercholesterolemia, unspecified: Secondary | ICD-10-CM

## 2014-02-20 DIAGNOSIS — R195 Other fecal abnormalities: Secondary | ICD-10-CM

## 2014-02-20 DIAGNOSIS — Z124 Encounter for screening for malignant neoplasm of cervix: Secondary | ICD-10-CM | POA: Diagnosis not present

## 2014-02-20 DIAGNOSIS — Z1151 Encounter for screening for human papillomavirus (HPV): Secondary | ICD-10-CM | POA: Diagnosis not present

## 2014-02-20 DIAGNOSIS — K921 Melena: Secondary | ICD-10-CM | POA: Diagnosis not present

## 2014-02-20 DIAGNOSIS — Z01419 Encounter for gynecological examination (general) (routine) without abnormal findings: Secondary | ICD-10-CM | POA: Diagnosis not present

## 2014-02-20 DIAGNOSIS — R635 Abnormal weight gain: Secondary | ICD-10-CM | POA: Diagnosis not present

## 2014-02-20 HISTORY — DX: Other fecal abnormalities: R19.5

## 2014-02-20 LAB — CBC
HEMATOCRIT: 40.8 % (ref 36.0–46.0)
HEMOGLOBIN: 13.9 g/dL (ref 12.0–15.0)
MCH: 30.6 pg (ref 26.0–34.0)
MCHC: 34.1 g/dL (ref 30.0–36.0)
MCV: 89.9 fL (ref 78.0–100.0)
Platelets: 239 10*3/uL (ref 150–400)
RBC: 4.54 MIL/uL (ref 3.87–5.11)
RDW: 13.6 % (ref 11.5–15.5)
WBC: 5.8 10*3/uL (ref 4.0–10.5)

## 2014-02-20 NOTE — Progress Notes (Signed)
Patient ID: [name], female   DOB: 05-07-1941, 73 y.o.   MRN: [id number] History of Present Illness: [name] is a 73 year old white female, married in for a pap and physical.   Current Medications, Allergies, Past Medical History, Past Surgical History, Family History and Social History were reviewed in [facility] record.     Review of Systems: Patient denies any headaches, blurred vision, shortness of breath, chest pain, abdominal pain, problems with bowel movements, urination, or intercourse. Sex not has often and is dry,had GB surgery this year and has bowel changes.No joint swelling or mood swings.Has noticed some weight gain.    Physical Exam:BP 100/66  Pulse 72  Ht 5\' 2"  (1.575 m)  Wt 168 lb (76.204 kg)  BMI 30.72 kg/m2 General:  Well developed, well nourished, no acute distress Skin:  Warm and dry Neck:  Midline trachea, normal thyroid, no carotid bruits Lungs; Clear to auscultation bilaterally Breast:  No dominant palpable mass, retraction, or nipple discharge Cardiovascular: Regular rate and rhythm Abdomen:  Soft, non tender, no hepatosplenomegaly Pelvic:  External genitalia is normal in appearance.  The vagina is normal in appearance for age, atrophic.  The cervix is atrophic, pap performed with HPV.  Uterus is felt to be normal size, shape, and contour.  No  adnexal masses or tenderness noted. Rectal: Good sphincter tone, no polyps, or hemorrhoids felt.  Hemoccult positive. Extremities:  No swelling or varicosities noted Psych:  No mood changes, alert and cooperative,seems happy, is active not typical 73 year old.   Impression: Yearly gyn exam + hemoccult Weight gain  Family history of elevated cholesterol    Plan: Physical in 2 years, may stop paps if this one normal Mammogram yearly Check CBC,CMP,TSH and lipids Will send 3 hemoccult cards home if one positive will refer to Dr Rehman,colonoscopy due next year Try luvena and good  lubricate with sex like astroglide or olive oil Will talk when labs make

## 2014-02-20 NOTE — Patient Instructions (Signed)
Do 3 cards at home and bring back Physical in 2 years Mammogram yearly  Call with labs

## 2014-02-21 ENCOUNTER — Telehealth: Payer: Self-pay | Admitting: Adult Health

## 2014-02-21 LAB — COMPREHENSIVE METABOLIC PANEL
ALK PHOS: 65 U/L (ref 39–117)
ALT: 13 U/L (ref 0–35)
AST: 12 U/L (ref 0–37)
Albumin: 4.3 g/dL (ref 3.5–5.2)
BILIRUBIN TOTAL: 0.5 mg/dL (ref 0.2–1.2)
BUN: 16 mg/dL (ref 6–23)
CO2: 25 mEq/L (ref 19–32)
CREATININE: 0.63 mg/dL (ref 0.50–1.10)
Calcium: 9.5 mg/dL (ref 8.4–10.5)
Chloride: 107 mEq/L (ref 96–112)
GLUCOSE: 90 mg/dL (ref 70–99)
Potassium: 4.5 mEq/L (ref 3.5–5.3)
Sodium: 141 mEq/L (ref 135–145)
Total Protein: 6.4 g/dL (ref 6.0–8.3)

## 2014-02-21 LAB — LIPID PANEL
CHOL/HDL RATIO: 3 ratio
Cholesterol: 199 mg/dL (ref 0–200)
HDL: 67 mg/dL (ref >39)
LDL CALC: 112 mg/dL — AB (ref 0–99)
TRIGLYCERIDES: 98 mg/dL (ref <150)
VLDL: 20 mg/dL (ref 0–40)

## 2014-02-21 LAB — TSH: TSH: 1.098 u[IU]/mL (ref 0.350–4.500)

## 2014-02-21 NOTE — Telephone Encounter (Signed)
Pt aware of labs will mail her copy

## 2014-02-24 DIAGNOSIS — R195 Other fecal abnormalities: Secondary | ICD-10-CM

## 2014-02-25 ENCOUNTER — Telehealth: Payer: Self-pay | Admitting: Adult Health

## 2014-02-25 DIAGNOSIS — R195 Other fecal abnormalities: Secondary | ICD-10-CM

## 2014-02-25 DIAGNOSIS — K921 Melena: Secondary | ICD-10-CM | POA: Diagnosis not present

## 2014-02-25 NOTE — Telephone Encounter (Signed)
Pt aware that 2 hemoccult cards were positive, will refer to Dr Laural Golden

## 2014-02-27 ENCOUNTER — Ambulatory Visit (HOSPITAL_COMMUNITY)
Admission: RE | Admit: 2014-02-27 | Discharge: 2014-02-27 | Disposition: A | Discharge status: Home / Self Care | Payer: Medicare Other | Source: Ambulatory Visit | Attending: Internal Medicine | Admitting: Internal Medicine

## 2014-02-27 DIAGNOSIS — Z1231 Encounter for screening mammogram for malignant neoplasm of breast: Secondary | ICD-10-CM | POA: Diagnosis not present

## 2014-02-28 DIAGNOSIS — Z961 Presence of intraocular lens: Secondary | ICD-10-CM | POA: Diagnosis not present

## 2014-02-28 DIAGNOSIS — H26499 Other secondary cataract, unspecified eye: Secondary | ICD-10-CM | POA: Diagnosis not present

## 2014-02-28 DIAGNOSIS — H18419 Arcus senilis, unspecified eye: Secondary | ICD-10-CM | POA: Diagnosis not present

## 2014-03-06 ENCOUNTER — Other Ambulatory Visit: Payer: Self-pay | Admitting: Internal Medicine

## 2014-03-06 ENCOUNTER — Telehealth: Payer: Self-pay | Admitting: Adult Health

## 2014-03-06 DIAGNOSIS — R928 Other abnormal and inconclusive findings on diagnostic imaging of breast: Secondary | ICD-10-CM

## 2014-03-06 MED ORDER — ALPRAZOLAM 0.25 MG PO TABS: 0.2500 mg | ORAL_TABLET | Freq: Every evening | ORAL | Status: DC | PRN

## 2014-03-06 NOTE — Telephone Encounter (Signed)
Pt has to have mammogram repeated, anxious will rx xanax

## 2014-03-11 ENCOUNTER — Encounter (INDEPENDENT_AMBULATORY_CARE_PROVIDER_SITE_OTHER): Payer: Self-pay | Admitting: Internal Medicine

## 2014-03-11 ENCOUNTER — Encounter (INDEPENDENT_AMBULATORY_CARE_PROVIDER_SITE_OTHER): Payer: Self-pay | Admitting: *Deleted

## 2014-03-11 ENCOUNTER — Other Ambulatory Visit (INDEPENDENT_AMBULATORY_CARE_PROVIDER_SITE_OTHER): Payer: Self-pay | Admitting: *Deleted

## 2014-03-11 ENCOUNTER — Ambulatory Visit (INDEPENDENT_AMBULATORY_CARE_PROVIDER_SITE_OTHER): Payer: Medicare Other | Admitting: Internal Medicine

## 2014-03-11 VITALS — BP 116/72 | HR 64 | Temp 97.7°F | Ht 63.0 in | Wt 165.7 lb

## 2014-03-11 DIAGNOSIS — R195 Other fecal abnormalities: Secondary | ICD-10-CM

## 2014-03-11 NOTE — Telephone Encounter (Signed)
This encounter was created in error - please disregard.

## 2014-03-11 NOTE — Progress Notes (Signed)
Subjective:     Patient ID: [name], female   DOB: 1941/08/04, 73 y.o.   MRN: [id number]  HPI Referred to our office for 2 positive hemocult cards about 2 weeks ago by Derrek Monaco NP-C.  Appetite is good. No weight loss. No dysphagia. GERD at times. No abdominal pain. BM x 1 every am. No melena or bright red rectal bleeding. Normal caliber. Last colonoscopy in 2010.  No family hx of colon cancer.   Colonoscopy 2010: Surveillance colonoscopy for adenomatous polyp. A small polyp. Small external hemorrhoids. Biopsy: tubular adenoma.  CBC    Component Value Date/Time   WBC 5.8 02/20/2014 1011   RBC 4.54 02/20/2014 1011   HGB 13.9 02/20/2014 1011   HCT 40.8 02/20/2014 1011   PLT 239 02/20/2014 1011   MCV 89.9 02/20/2014 1011   MCH 30.6 02/20/2014 1011   MCHC 34.1 02/20/2014 1011   RDW 13.6 02/20/2014 1011   LYMPHSABS 2.5 11/28/2013 1400   MONOABS 0.5 11/28/2013 1400   EOSABS 0.0 11/28/2013 1400   BASOSABS 0.0 11/28/2013 1400    CMP     Component Value Date/Time   NA 141 02/20/2014 1011   K 4.5 02/20/2014 1011   CL 107 02/20/2014 1011   CO2 25 02/20/2014 1011   GLUCOSE 90 02/20/2014 1011   BUN 16 02/20/2014 1011   CREATININE 0.63 02/20/2014 1011   CREATININE 0.68 11/30/2013 0634   CALCIUM 9.5 02/20/2014 1011   PROT 6.4 02/20/2014 1011   ALBUMIN 4.3 02/20/2014 1011   AST 12 02/20/2014 1011   ALT 13 02/20/2014 1011   ALKPHOS 65 02/20/2014 1011   BILITOT 0.5 02/20/2014 1011   GFRNONAA 86* 11/30/2013 0634   GFRAA >90 11/30/2013 0634       Review of Systems     Past Medical History  Diagnosis Date  . Positive fecal occult blood test 02/20/2014    Will send 3 cards home    Past Surgical History  Procedure Laterality Date  . Tonsillectomy    . Cataract extraction, bilateral      [place]  . Breast surgery Left     partail mastectomy-benign  . Cholecystectomy N/A 11/29/2013    Procedure: LAPAROSCOPIC CHOLECYSTECTOMY;  Surgeon: Scherry Ran, MD;  Location: [facility];  Service: General;  Laterality:  N/A;    No Known Allergies  Current Outpatient Prescriptions on File Prior to Visit  Medication Sig Dispense Refill  . ALPRAZolam (XANAX) 0.25 MG tablet Take 1 tablet (0.25 mg total) by mouth at bedtime as needed for anxiety.  30 tablet  0   No current facility-administered medications on file prior to visit.   Married, 2 adopted children. 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   Objective:   Physical Exam  Filed Vitals:   03/11/14 1043  BP: 116/72  Pulse: 64  Temp: 97.7 F (36.5 C)  Height: 5\' 3"  (1.6 m)  Weight: 165 lb 11.2 oz (75.161 kg)        Assessment:    Heme positive stool. Colonic carcinoma needs to be ruled out.  Hemorrhoids is also in the differential.     Plan:     Colonoscopy with Dr Laural Golden.

## 2014-03-11 NOTE — Patient Instructions (Signed)
Colonoscopy with Dr. Rehman. The risks and benefits such as perforation, bleeding, and infection were reviewed with the patient and is agreeable. 

## 2014-03-12 ENCOUNTER — Encounter (HOSPITAL_COMMUNITY): Payer: Self-pay | Admitting: Pharmacy Technician

## 2014-03-14 ENCOUNTER — Encounter (HOSPITAL_COMMUNITY): Payer: Self-pay | Admitting: *Deleted

## 2014-03-14 ENCOUNTER — Encounter (HOSPITAL_COMMUNITY)
Admission: RE | Disposition: A | Discharge status: Home / Self Care | Payer: Self-pay | Source: Ambulatory Visit | Attending: Internal Medicine

## 2014-03-14 ENCOUNTER — Ambulatory Visit (HOSPITAL_COMMUNITY)
Admission: RE | Admit: 2014-03-14 | Discharge: 2014-03-14 | Disposition: A | Discharge status: Home / Self Care | Payer: Medicare Other | Source: Ambulatory Visit | Attending: Internal Medicine | Admitting: Internal Medicine

## 2014-03-14 ENCOUNTER — Other Ambulatory Visit: Payer: Medicare Other

## 2014-03-14 DIAGNOSIS — R195 Other fecal abnormalities: Secondary | ICD-10-CM | POA: Diagnosis not present

## 2014-03-14 DIAGNOSIS — Z860100 Personal history of colon polyps, unspecified: Secondary | ICD-10-CM | POA: Insufficient documentation

## 2014-03-14 DIAGNOSIS — D126 Benign neoplasm of colon, unspecified: Secondary | ICD-10-CM | POA: Diagnosis not present

## 2014-03-14 DIAGNOSIS — K644 Residual hemorrhoidal skin tags: Secondary | ICD-10-CM | POA: Insufficient documentation

## 2014-03-14 DIAGNOSIS — K921 Melena: Secondary | ICD-10-CM | POA: Diagnosis not present

## 2014-03-14 DIAGNOSIS — Z87891 Personal history of nicotine dependence: Secondary | ICD-10-CM | POA: Diagnosis not present

## 2014-03-14 HISTORY — PX: COLONOSCOPY: SHX5424

## 2014-03-14 SURGERY — COLONOSCOPY
Anesthesia: Moderate Sedation

## 2014-03-14 MED ORDER — STERILE WATER FOR IRRIGATION IR SOLN
Status: DC | PRN
  Administered 2014-03-14: 13:00:00

## 2014-03-14 MED ORDER — MEPERIDINE HCL 50 MG/ML IJ SOLN
INTRAMUSCULAR | Status: AC
  Filled 2014-03-14: qty 1

## 2014-03-14 MED ORDER — MIDAZOLAM HCL 5 MG/5ML IJ SOLN
INTRAMUSCULAR | Status: DC | PRN
  Administered 2014-03-14: 2 mg via INTRAVENOUS
  Administered 2014-03-14 (×2): 1 mg via INTRAVENOUS
  Administered 2014-03-14: 2 mg via INTRAVENOUS

## 2014-03-14 MED ORDER — MIDAZOLAM HCL 5 MG/5ML IJ SOLN
INTRAMUSCULAR | Status: AC
  Filled 2014-03-14: qty 10

## 2014-03-14 MED ORDER — MEPERIDINE HCL 50 MG/ML IJ SOLN
INTRAMUSCULAR | Status: DC | PRN
  Administered 2014-03-14 (×2): 25 mg via INTRAVENOUS

## 2014-03-14 MED ORDER — SODIUM CHLORIDE 0.9 % IV SOLN
INTRAVENOUS | Status: DC
  Administered 2014-03-14: 12:00:00 via INTRAVENOUS

## 2014-03-14 NOTE — H&P (Signed)
[name] is an 73 y.o. female.   Chief Complaint: Patient is here for colonoscopy. HPI: Patient is 73 year old Caucasian female who was recently found to have heme-positive stool and routine examination at the time of gynecologic evaluation. She subsequently had 3 Hemoccults and 2 were positive. Her H&H is normal. She denies melena rectal bleeding or abdominal pain. She has good appetite and her weight has been stable. She does not take OTC NSAIDs. Colonoscopy was in December 20/10 with removal of small polyp which is a tubular adenoma. Number history is negative for CRC.  Past Medical History  Diagnosis Date  . Positive fecal occult blood test 02/20/2014    Will send 3 cards home    Past Surgical History  Procedure Laterality Date  . Tonsillectomy    . Cataract extraction, bilateral      [place]  . Breast surgery Left     partail mastectomy-benign  . Cholecystectomy N/A 11/29/2013    Procedure: LAPAROSCOPIC CHOLECYSTECTOMY;  Surgeon: Scherry Ran, MD;  Location: [facility];  Service: General;  Laterality: N/A;    Family History  Problem Relation Age of Onset  . Cancer Mother     lung  . Hypertension Mother   . Cancer Father     lung  . Hypertension Father   . Cancer Sister     breast  . Heart disease Maternal Grandfather   . Heart attack Maternal Grandfather   . Heart disease Paternal Grandfather   . Heart attack Paternal Grandfather   . Cancer Other     breast    Social History:  reports that she quit smoking about 46 years ago. Her smoking use included Cigarettes. She has a 2 pack-year smoking history. She has never used smokeless tobacco. She reports that she drinks about 3 ounces of alcohol per week. She reports that she does not use illicit drugs.  Allergies: No Known Allergies  Medications Prior to Admission  Medication Sig Dispense Refill  . b complex vitamins tablet Take 1 tablet by mouth daily.      . Cholecalciferol (VITAMIN D PO) Take 1 tablet by  mouth daily.        No results found for this or any previous visit (from the past 48 hour(s)). No results found.  ROS  Blood pressure 107/86, pulse 92, temperature 98 F (36.7 C), temperature source Oral, resp. rate 20, height 5\' 3"  (1.6 m), weight 165 lb (74.844 kg), SpO2 100.00%. Physical Exam  Constitutional: She appears well-developed and well-nourished.  HENT:  Mouth/Throat: Oropharynx is clear and moist.  Eyes: Conjunctivae are normal. No scleral icterus.  Neck: No thyromegaly present.  Cardiovascular: Normal rate, regular rhythm and normal heart sounds.   Respiratory: Effort normal and breath sounds normal.  GI: Soft. She exhibits no distension and no mass. There is no tenderness.  Musculoskeletal: She exhibits no edema.  Lymphadenopathy:    She has no cervical adenopathy.  Neurological: She is alert.  Skin: Skin is warm and dry.     Assessment/Plan Heme-positive stools. history of colonic adenoma. Diagnostic colonoscopy  [name] 03/14/2014, 1:01 PM

## 2014-03-14 NOTE — Op Note (Signed)
COLONOSCOPY PROCEDURE REPORT  PATIENT:  [name]  MR#:  [id number] Birthdate:  04-13-1941, 73 y.o., female Endoscopist:  Dr. Rogene Houston, MD Referred By: Ms. Derrek Monaco, NP  Procedure Date: 03/14/2014  Procedure:   Colonoscopy  Indications:  Patient is 73 year old Caucasian female who was noted to have heme positive stool on routine gynecologic exam and subsequently had two out of three heme-positive stools. She has no GI symptoms. Patient's last colonoscopy was in November 2010 with removal of small tubular adenoma. She had her first colonoscopy in 2005 with removal of 3 small polyps and one was tubular adenoma. Patient does not take OTC NSAIDs. Family history is negative for CRC.  Informed Consent:  The procedure and risks were reviewed with the patient and informed consent was obtained.  Medications:  Demerol 50 mg IV Versed 6 mg IV  Description of procedure:  After a digital rectal exam was performed, that colonoscope was advanced from the anus through the rectum and colon to the area of the cecum, ileocecal valve and appendiceal orifice. The cecum was deeply intubated. These structures were well-seen and photographed for the record. From the level of the cecum and ileocecal valve, the scope was slowly and cautiously withdrawn. The mucosal surfaces were carefully surveyed utilizing scope tip to flexion to facilitate fold flattening as needed. The scope was pulled down into the rectum where a thorough exam including retroflexion was performed. Terminal ileum was also examined.  Findings:   Prep excellent. Normal mucosa of terminal ileum. 2 small polyps ablated via cold biopsy from the hepatic flexure and submitted together. Mucosa of rest of the colon and rectum was normal. Small hemorrhoids below the dentate line along with 3 anal papillae.    Therapeutic/Diagnostic Maneuvers Performed:  See above  Complications:  None  Cecal Withdrawal Time:  18  minutes  Impression:  Normal mucosa of terminal ileum. Two small polyps ablated via cold biopsy from hepatic flexure and submitted together. External hemorrhoids and three small anal papillae.  Comment; No lesions noted on this exam to account for heme positive stool.  Recommendations:  Standard instructions given. I will contact patient with biopsy results and further recommendations. Stool guaiac H&H in one month.  Rogene Houston  03/14/2014 1:43 PM  CC: Dr. Delphina Cahill, MD & Dr. Rayne Du ref. provider found CC: Ms. Derrek Monaco, NP

## 2014-03-14 NOTE — Discharge Instructions (Signed)
Resume usual medications and diet. No driving for 24 hours. Patient will call with biopsy results. Stool guaiac and H&H in one month.  Colonoscopy, Care After Refer to this sheet in the next few weeks. These instructions provide you with information on caring for yourself after your procedure. Your health care provider may also give you more specific instructions. Your treatment has been planned according to current medical practices, but problems sometimes occur. Call your health care provider if you have any problems or questions after your procedure. WHAT TO EXPECT AFTER THE PROCEDURE  After your procedure, it is typical to have the following:  A small amount of blood in your stool.  Moderate amounts of gas and mild abdominal cramping or bloating. HOME CARE INSTRUCTIONS  Do not drive, operate machinery, or sign important documents for 24 hours.  You may shower and resume your regular physical activities, but move at a slower pace for the first 24 hours.  Take frequent rest periods for the first 24 hours.  Walk around or put a warm pack on your abdomen to help reduce abdominal cramping and bloating.  Drink enough fluids to keep your urine clear or pale yellow.  You may resume your normal diet as instructed by your health care provider. Avoid heavy or fried foods that are hard to digest.  Avoid drinking alcohol for 24 hours or as instructed by your health care provider.  Only take over-the-counter or prescription medicines as directed by your health care provider.  If a tissue sample (biopsy) was taken during your procedure:  Do not take aspirin or blood thinners for 7 days, or as instructed by your health care provider.  Do not drink alcohol for 7 days, or as instructed by your health care provider.  Eat soft foods for the first 24 hours. SEEK MEDICAL CARE IF: You have persistent spotting of blood in your stool 2 3 days after the procedure. SEEK IMMEDIATE MEDICAL CARE  IF:  You have more than a small spotting of blood in your stool.  You pass large blood clots in your stool.  Your abdomen is swollen (distended).  You have nausea or vomiting.  You have a fever.  You have increasing abdominal pain that is not relieved with medicine. Document Released: 06/14/2004 Document Revised: 08/21/2013 Document Reviewed: 07/08/2013 [facility] Patient Information 2014 [place].

## 2014-03-17 ENCOUNTER — Telehealth (INDEPENDENT_AMBULATORY_CARE_PROVIDER_SITE_OTHER): Payer: Self-pay | Admitting: *Deleted

## 2014-03-17 NOTE — Telephone Encounter (Signed)
Forwarded to Dr.Rehman. 

## 2014-03-17 NOTE — Telephone Encounter (Signed)
Per op note, patient needs Stool guaiac H&H in one month.

## 2014-03-17 NOTE — Telephone Encounter (Signed)
Joe and Jaynell will be out of town this week. If Dr. Laural Golden tries to call with the test results, please call on the cell number [phone].

## 2014-03-18 ENCOUNTER — Encounter (HOSPITAL_COMMUNITY): Payer: Self-pay | Admitting: Internal Medicine

## 2014-03-18 ENCOUNTER — Telehealth (INDEPENDENT_AMBULATORY_CARE_PROVIDER_SITE_OTHER): Payer: Self-pay | Admitting: *Deleted

## 2014-03-18 DIAGNOSIS — R195 Other fecal abnormalities: Secondary | ICD-10-CM

## 2014-03-18 NOTE — Telephone Encounter (Signed)
Lab and guaiac card noted for 06-05-015.

## 2014-03-18 NOTE — Telephone Encounter (Signed)
Per Dr.Rehman the patient will need to have labs drawn in 1 month and she will also need a guaiac card.

## 2014-03-19 ENCOUNTER — Encounter (INDEPENDENT_AMBULATORY_CARE_PROVIDER_SITE_OTHER): Payer: Self-pay | Admitting: *Deleted

## 2014-03-19 NOTE — Telephone Encounter (Signed)
Patient had 2 small polyps removed from the hepatic flexure. One polyp was tubular adenoma and the other polyp is sessile serrated polyp Results reviewed with her husband Joe Next colonoscopy in 5 years. Patient needs H&H and Hemoccult in one month Report to Ms. Derrek Monaco and PCP

## 2014-03-20 ENCOUNTER — Ambulatory Visit
Admission: RE | Admit: 2014-03-20 | Discharge: 2014-03-20 | Disposition: A | Discharge status: Home / Self Care | Payer: Medicare Other | Source: Ambulatory Visit | Attending: Internal Medicine | Admitting: Internal Medicine

## 2014-03-20 DIAGNOSIS — R928 Other abnormal and inconclusive findings on diagnostic imaging of breast: Secondary | ICD-10-CM

## 2014-03-20 DIAGNOSIS — N6019 Diffuse cystic mastopathy of unspecified breast: Secondary | ICD-10-CM | POA: Diagnosis not present

## 2014-03-20 NOTE — Telephone Encounter (Signed)
Lab and hemoccult  are noted. Forward to Erlene Senters and her PCP

## 2014-03-20 NOTE — Telephone Encounter (Signed)
procedure note and pathology result letter faxed to PCP and Derrek Monaco is on [facility]

## 2014-04-09 ENCOUNTER — Other Ambulatory Visit (INDEPENDENT_AMBULATORY_CARE_PROVIDER_SITE_OTHER): Payer: Self-pay | Admitting: *Deleted

## 2014-04-09 ENCOUNTER — Encounter (INDEPENDENT_AMBULATORY_CARE_PROVIDER_SITE_OTHER): Payer: Self-pay | Admitting: *Deleted

## 2014-04-09 DIAGNOSIS — R195 Other fecal abnormalities: Secondary | ICD-10-CM

## 2014-04-15 DIAGNOSIS — R195 Other fecal abnormalities: Secondary | ICD-10-CM | POA: Diagnosis not present

## 2014-04-15 LAB — HEMOGLOBIN AND HEMATOCRIT, BLOOD
HEMATOCRIT: 40.7 % (ref 36.0–46.0)
Hemoglobin: 13.7 g/dL (ref 12.0–15.0)

## 2014-04-16 ENCOUNTER — Telehealth (INDEPENDENT_AMBULATORY_CARE_PROVIDER_SITE_OTHER): Payer: Self-pay | Admitting: *Deleted

## 2014-04-16 DIAGNOSIS — K921 Melena: Secondary | ICD-10-CM

## 2014-04-16 NOTE — Telephone Encounter (Signed)
Per Dr.Rehman the patient will need to have labs drawn in 6 months.   Diagnosis:    Result(s)   Card 1:Negative:           Completed by:    HEMOCCULT SENSA DEVELOPER: LOT#:  [id number] EXPIRATION DATE: 2016-05   HEMOCCULT SENSA CARD:  LOT#: [id number]  EXPIRATION DATE: 09-15   CARD CONTROL RESULTS:  POSITIVE: Positive NEGATIVE: Negative    ADDITIONAL COMMENTS: [name] was called and made aware of both her hemoccult and H&H results. Repeat labs in 6 months. Lab is noted for December.

## 2014-04-17 NOTE — Telephone Encounter (Signed)
Report faxed to Dr Nevada Crane

## 2014-04-17 NOTE — Telephone Encounter (Signed)
Already addressed; H&H is normal and stool is guaiac-negative She will have repeat H&H in 6 months. Report to Dr. Wende Neighbors

## 2014-05-19 DIAGNOSIS — R109 Unspecified abdominal pain: Secondary | ICD-10-CM | POA: Diagnosis not present

## 2014-05-19 DIAGNOSIS — R0789 Other chest pain: Secondary | ICD-10-CM | POA: Diagnosis not present

## 2014-05-20 DIAGNOSIS — R109 Unspecified abdominal pain: Secondary | ICD-10-CM | POA: Diagnosis not present

## 2014-05-20 DIAGNOSIS — R0789 Other chest pain: Secondary | ICD-10-CM | POA: Diagnosis not present

## 2014-06-03 DIAGNOSIS — M542 Cervicalgia: Secondary | ICD-10-CM | POA: Diagnosis not present

## 2014-06-17 DIAGNOSIS — M542 Cervicalgia: Secondary | ICD-10-CM | POA: Diagnosis not present

## 2014-08-15 DIAGNOSIS — L723 Sebaceous cyst: Secondary | ICD-10-CM | POA: Diagnosis not present

## 2014-08-15 DIAGNOSIS — L989 Disorder of the skin and subcutaneous tissue, unspecified: Secondary | ICD-10-CM | POA: Diagnosis not present

## 2014-09-16 DIAGNOSIS — Z23 Encounter for immunization: Secondary | ICD-10-CM | POA: Diagnosis not present

## 2014-09-30 DIAGNOSIS — D485 Neoplasm of uncertain behavior of skin: Secondary | ICD-10-CM | POA: Diagnosis not present

## 2014-09-30 DIAGNOSIS — D225 Melanocytic nevi of trunk: Secondary | ICD-10-CM | POA: Diagnosis not present

## 2014-10-01 ENCOUNTER — Encounter (INDEPENDENT_AMBULATORY_CARE_PROVIDER_SITE_OTHER): Payer: Self-pay | Admitting: *Deleted

## 2014-10-01 ENCOUNTER — Other Ambulatory Visit (INDEPENDENT_AMBULATORY_CARE_PROVIDER_SITE_OTHER): Payer: Self-pay | Admitting: *Deleted

## 2014-10-01 DIAGNOSIS — K921 Melena: Secondary | ICD-10-CM

## 2014-10-16 DIAGNOSIS — K921 Melena: Secondary | ICD-10-CM | POA: Diagnosis not present

## 2014-10-16 LAB — HEMOGLOBIN AND HEMATOCRIT, BLOOD
HEMATOCRIT: 40.2 % (ref 36.0–46.0)
HEMOGLOBIN: 13.7 g/dL (ref 12.0–15.0)

## 2015-02-02 ENCOUNTER — Other Ambulatory Visit: Payer: Self-pay | Admitting: Internal Medicine

## 2015-02-02 ENCOUNTER — Other Ambulatory Visit: Payer: Self-pay

## 2015-02-02 DIAGNOSIS — Z1231 Encounter for screening mammogram for malignant neoplasm of breast: Secondary | ICD-10-CM

## 2015-03-02 ENCOUNTER — Ambulatory Visit
Admission: RE | Admit: 2015-03-02 | Discharge: 2015-03-02 | Disposition: A | Discharge status: Home / Self Care | Payer: PRIVATE HEALTH INSURANCE | Source: Ambulatory Visit

## 2015-03-02 DIAGNOSIS — Z1231 Encounter for screening mammogram for malignant neoplasm of breast: Secondary | ICD-10-CM | POA: Diagnosis not present

## 2015-03-03 DIAGNOSIS — H40003 Preglaucoma, unspecified, bilateral: Secondary | ICD-10-CM | POA: Diagnosis not present

## 2015-03-03 DIAGNOSIS — Z961 Presence of intraocular lens: Secondary | ICD-10-CM | POA: Diagnosis not present

## 2015-03-03 DIAGNOSIS — H18413 Arcus senilis, bilateral: Secondary | ICD-10-CM | POA: Diagnosis not present

## 2015-03-03 DIAGNOSIS — H02839 Dermatochalasis of unspecified eye, unspecified eyelid: Secondary | ICD-10-CM | POA: Diagnosis not present

## 2015-08-24 ENCOUNTER — Other Ambulatory Visit: Payer: Self-pay

## 2015-08-24 ENCOUNTER — Ambulatory Visit (INDEPENDENT_AMBULATORY_CARE_PROVIDER_SITE_OTHER): Payer: Medicare Other | Admitting: Adult Health

## 2015-08-24 ENCOUNTER — Other Ambulatory Visit: Payer: Self-pay | Admitting: Adult Health

## 2015-08-24 ENCOUNTER — Encounter: Payer: Self-pay | Admitting: Adult Health

## 2015-08-24 VITALS — BP 156/90 | HR 80 | Ht 63.0 in | Wt 164.0 lb

## 2015-08-24 DIAGNOSIS — N63 Unspecified lump in breast: Secondary | ICD-10-CM | POA: Diagnosis not present

## 2015-08-24 DIAGNOSIS — N6320 Unspecified lump in the left breast, unspecified quadrant: Secondary | ICD-10-CM

## 2015-08-24 HISTORY — DX: Unspecified lump in the left breast, unspecified quadrant: N63.20

## 2015-08-24 NOTE — Patient Instructions (Signed)
Breast mammogram and [place] 10/12 at 8 am at breast center Follow up  prn

## 2015-08-24 NOTE — Progress Notes (Signed)
Subjective:     Patient ID: [name], female   DOB: Dec 27, 1940, 74 y.o.   MRN: [id number]  HPI [name] is a 74 year old white female,married, worked in,complaining of tingling in left breast Thursday night and then on Friday felt mass, had mammogram in April 2016 with fibroglandular densities.  Review of Systems + left breast mass, all other systems reviewed negative Reviewed past medical,surgical, social and family history. Reviewed medications and allergies.     Objective:   Physical Exam BP 156/90 mmHg  Pulse 80  Ht 5\' 3"  (1.6 m)  Wt 164 lb (74.39 kg)  BMI 29.06 kg/m2  Skin warm and dry,  Breasts:no dominate palpable mass, retraction or nipple discharge on right, on left no retraction or nipple discharge, but has 2 x 3 cm tender mass at 2 o'clock about 2 FB from areola, will get diagnostic mammogram and [place].    Assessment:     Left breast mass    Plan:     Left diagnostic mammogram and [place] if needed at [facility] in [place], 10/12 at 8 am Follow up prn

## 2015-08-26 ENCOUNTER — Other Ambulatory Visit: Payer: Self-pay | Admitting: Adult Health

## 2015-08-26 ENCOUNTER — Ambulatory Visit
Admission: RE | Admit: 2015-08-26 | Discharge: 2015-08-26 | Disposition: A | Discharge status: Home / Self Care | Payer: Medicare Other | Source: Ambulatory Visit | Attending: Adult Health | Admitting: Adult Health

## 2015-08-26 DIAGNOSIS — N6002 Solitary cyst of left breast: Secondary | ICD-10-CM | POA: Diagnosis not present

## 2015-08-26 DIAGNOSIS — N6012 Diffuse cystic mastopathy of left breast: Secondary | ICD-10-CM | POA: Diagnosis not present

## 2015-08-26 DIAGNOSIS — N6320 Unspecified lump in the left breast, unspecified quadrant: Secondary | ICD-10-CM

## 2015-09-18 DIAGNOSIS — Z23 Encounter for immunization: Secondary | ICD-10-CM | POA: Diagnosis not present

## 2016-01-19 ENCOUNTER — Other Ambulatory Visit: Payer: Self-pay

## 2016-01-19 DIAGNOSIS — Z1231 Encounter for screening mammogram for malignant neoplasm of breast: Secondary | ICD-10-CM

## 2016-03-02 ENCOUNTER — Ambulatory Visit
Admission: RE | Admit: 2016-03-02 | Discharge: 2016-03-02 | Disposition: A | Discharge status: Home / Self Care | Payer: Medicare Other | Source: Ambulatory Visit

## 2016-03-02 DIAGNOSIS — Z1231 Encounter for screening mammogram for malignant neoplasm of breast: Secondary | ICD-10-CM | POA: Diagnosis not present

## 2016-03-03 ENCOUNTER — Telehealth: Payer: Self-pay | Admitting: *Deleted

## 2016-03-03 ENCOUNTER — Other Ambulatory Visit: Payer: Self-pay | Admitting: Obstetrics & Gynecology

## 2016-03-03 DIAGNOSIS — R928 Other abnormal and inconclusive findings on diagnostic imaging of breast: Secondary | ICD-10-CM

## 2016-03-03 NOTE — Telephone Encounter (Signed)
Spoke with pt. Pt called wanting results of mammogram. Mammogram results suggests getting more pictures. They will contact pt and get set up. Pt voiced understanding and states this is the 3rd year she has had to have more pics taken. Impact

## 2016-03-07 ENCOUNTER — Ambulatory Visit
Admission: RE | Admit: 2016-03-07 | Discharge: 2016-03-07 | Disposition: A | Discharge status: Home / Self Care | Payer: Medicare Other | Source: Ambulatory Visit | Attending: Obstetrics & Gynecology | Admitting: Obstetrics & Gynecology

## 2016-03-07 DIAGNOSIS — N63 Unspecified lump in breast: Secondary | ICD-10-CM | POA: Diagnosis not present

## 2016-03-07 DIAGNOSIS — N6012 Diffuse cystic mastopathy of left breast: Secondary | ICD-10-CM | POA: Diagnosis not present

## 2016-03-07 DIAGNOSIS — R928 Other abnormal and inconclusive findings on diagnostic imaging of breast: Secondary | ICD-10-CM

## 2016-06-30 DIAGNOSIS — Z961 Presence of intraocular lens: Secondary | ICD-10-CM | POA: Diagnosis not present

## 2016-06-30 DIAGNOSIS — H26493 Other secondary cataract, bilateral: Secondary | ICD-10-CM | POA: Diagnosis not present

## 2016-06-30 DIAGNOSIS — H40003 Preglaucoma, unspecified, bilateral: Secondary | ICD-10-CM | POA: Diagnosis not present

## 2016-06-30 DIAGNOSIS — H18413 Arcus senilis, bilateral: Secondary | ICD-10-CM | POA: Diagnosis not present

## 2016-07-15 ENCOUNTER — Other Ambulatory Visit: Payer: Self-pay

## 2016-09-12 DIAGNOSIS — Z23 Encounter for immunization: Secondary | ICD-10-CM | POA: Diagnosis not present

## 2016-12-16 ENCOUNTER — Encounter: Payer: Self-pay | Admitting: Internal Medicine

## 2017-02-14 ENCOUNTER — Other Ambulatory Visit: Payer: Self-pay | Admitting: Obstetrics & Gynecology

## 2017-02-14 DIAGNOSIS — Z1231 Encounter for screening mammogram for malignant neoplasm of breast: Secondary | ICD-10-CM

## 2017-03-08 ENCOUNTER — Ambulatory Visit
Admission: RE | Admit: 2017-03-08 | Discharge: 2017-03-08 | Disposition: A | Discharge status: Home / Self Care | Payer: Medicare Other | Source: Ambulatory Visit | Attending: Obstetrics & Gynecology | Admitting: Obstetrics & Gynecology

## 2017-03-08 DIAGNOSIS — Z1231 Encounter for screening mammogram for malignant neoplasm of breast: Secondary | ICD-10-CM

## 2017-05-23 DIAGNOSIS — L570 Actinic keratosis: Secondary | ICD-10-CM | POA: Diagnosis not present

## 2017-05-23 DIAGNOSIS — X32XXXA Exposure to sunlight, initial encounter: Secondary | ICD-10-CM | POA: Diagnosis not present

## 2017-05-23 DIAGNOSIS — D485 Neoplasm of uncertain behavior of skin: Secondary | ICD-10-CM | POA: Diagnosis not present

## 2017-05-23 DIAGNOSIS — Z1283 Encounter for screening for malignant neoplasm of skin: Secondary | ICD-10-CM | POA: Diagnosis not present

## 2017-05-23 DIAGNOSIS — D225 Melanocytic nevi of trunk: Secondary | ICD-10-CM | POA: Diagnosis not present

## 2017-05-23 DIAGNOSIS — D2271 Melanocytic nevi of right lower limb, including hip: Secondary | ICD-10-CM | POA: Diagnosis not present

## 2017-05-29 DIAGNOSIS — D485 Neoplasm of uncertain behavior of skin: Secondary | ICD-10-CM | POA: Diagnosis not present

## 2017-05-29 DIAGNOSIS — L988 Other specified disorders of the skin and subcutaneous tissue: Secondary | ICD-10-CM | POA: Diagnosis not present

## 2017-07-28 DIAGNOSIS — H40003 Preglaucoma, unspecified, bilateral: Secondary | ICD-10-CM | POA: Diagnosis not present

## 2017-08-21 DIAGNOSIS — Z23 Encounter for immunization: Secondary | ICD-10-CM | POA: Diagnosis not present

## 2017-09-11 DIAGNOSIS — L570 Actinic keratosis: Secondary | ICD-10-CM | POA: Diagnosis not present

## 2017-09-11 DIAGNOSIS — X32XXXD Exposure to sunlight, subsequent encounter: Secondary | ICD-10-CM | POA: Diagnosis not present

## 2017-09-11 DIAGNOSIS — Z1283 Encounter for screening for malignant neoplasm of skin: Secondary | ICD-10-CM | POA: Diagnosis not present

## 2017-09-11 DIAGNOSIS — D225 Melanocytic nevi of trunk: Secondary | ICD-10-CM | POA: Diagnosis not present

## 2018-02-12 ENCOUNTER — Other Ambulatory Visit: Payer: Self-pay | Admitting: Adult Health

## 2018-02-12 DIAGNOSIS — Z1231 Encounter for screening mammogram for malignant neoplasm of breast: Secondary | ICD-10-CM

## 2018-03-12 ENCOUNTER — Ambulatory Visit
Admission: RE | Admit: 2018-03-12 | Discharge: 2018-03-12 | Disposition: A | Discharge status: Home / Self Care | Payer: Medicare Other | Source: Ambulatory Visit | Attending: Adult Health | Admitting: Adult Health

## 2018-03-12 DIAGNOSIS — Z1231 Encounter for screening mammogram for malignant neoplasm of breast: Secondary | ICD-10-CM | POA: Diagnosis not present

## 2018-06-07 ENCOUNTER — Other Ambulatory Visit: Payer: Self-pay

## 2018-06-07 ENCOUNTER — Ambulatory Visit (INDEPENDENT_AMBULATORY_CARE_PROVIDER_SITE_OTHER): Payer: Medicare Other | Admitting: Adult Health

## 2018-06-07 ENCOUNTER — Encounter: Payer: Self-pay | Admitting: Adult Health

## 2018-06-07 VITALS — BP 178/98 | HR 84 | Ht 63.0 in | Wt 174.0 lb

## 2018-06-07 DIAGNOSIS — F419 Anxiety disorder, unspecified: Secondary | ICD-10-CM

## 2018-06-07 MED ORDER — BUSPIRONE HCL 5 MG PO TABS: 5.0000 mg | ORAL_TABLET | Freq: Two times a day (BID) | ORAL | 3 refills | Status: DC

## 2018-06-07 NOTE — Progress Notes (Signed)
  Subjective:     Patient ID: [name], female   DOB: May 05, 1941, 77 y.o.   MRN: [id number]  HPI [name] is a 77 year old white female in complaining of "feeling tearful land fearful. PCP is [employer].  Review of Systems "tearful and fearful" Not sleeping well, but if takes 1 Advil PM or has wine sleeps better Reviewed past medical,surgical, social and family history. Reviewed medications and allergies.     Objective:   Physical Exam BP (!) 178/98 (BP Location: Right Arm, Patient Position: Sitting, Cuff Size: Normal)   Pulse 84   Ht 5\' 3"  (1.6 m)   Wt 174 lb (78.9 kg)   BMI 30.82 kg/m   PHQ 2 score 0. Talk only: [name] says she can cry if happy or sad and that everything is good at home with husband and children and her health is good. Just had friend diagnosed with ovarian cancer. Grandson going to college in the August. Will Rx buspar 5 mg 1 bid and F/U in about 4 weeks, she has physical scheduled and wants labs. Discussed can increase meds if needed. Face time about 15 minutes, listening to her talk, and counseling.     Assessment:     1. Anxiety       Plan:     Meds ordered this encounter  Medications  . busPIRone (BUSPAR) 5 MG tablet    Sig: Take 1 tablet (5 mg total) by mouth 2 (two) times daily.    Dispense:  60 tablet    Refill:  3    Order Specific Question:   Supervising Provider    Answer:   Florian Buff [[id number]]  F/U 8/21, already scheduled for physical and wants labs

## 2018-06-22 DIAGNOSIS — G4700 Insomnia, unspecified: Secondary | ICD-10-CM | POA: Diagnosis not present

## 2018-06-22 DIAGNOSIS — I10 Essential (primary) hypertension: Secondary | ICD-10-CM | POA: Diagnosis not present

## 2018-06-22 DIAGNOSIS — J060 Acute laryngopharyngitis: Secondary | ICD-10-CM | POA: Diagnosis not present

## 2018-06-22 DIAGNOSIS — Z6830 Body mass index (BMI) 30.0-30.9, adult: Secondary | ICD-10-CM | POA: Diagnosis not present

## 2018-06-22 DIAGNOSIS — E559 Vitamin D deficiency, unspecified: Secondary | ICD-10-CM | POA: Diagnosis not present

## 2018-06-22 DIAGNOSIS — F419 Anxiety disorder, unspecified: Secondary | ICD-10-CM | POA: Diagnosis not present

## 2018-06-22 DIAGNOSIS — R05 Cough: Secondary | ICD-10-CM | POA: Diagnosis not present

## 2018-07-04 ENCOUNTER — Encounter: Payer: Self-pay | Admitting: Adult Health

## 2018-07-04 ENCOUNTER — Ambulatory Visit (INDEPENDENT_AMBULATORY_CARE_PROVIDER_SITE_OTHER): Payer: Medicare Other | Admitting: Adult Health

## 2018-07-04 VITALS — BP 136/77 | HR 80 | Ht 63.0 in | Wt 178.0 lb

## 2018-07-04 DIAGNOSIS — Z01419 Encounter for gynecological examination (general) (routine) without abnormal findings: Secondary | ICD-10-CM

## 2018-07-04 DIAGNOSIS — Z124 Encounter for screening for malignant neoplasm of cervix: Secondary | ICD-10-CM

## 2018-07-04 DIAGNOSIS — F419 Anxiety disorder, unspecified: Secondary | ICD-10-CM

## 2018-07-04 NOTE — Progress Notes (Addendum)
Patient ID: [name], female   DOB: 1941/10/10, 77 y.o.   MRN: [id number] History of Present Illness: [name] is a 77 year old white female,married, PM, in for well woman gyn exam. She was recently started on losartan for BP and had labs at PCP. She is leaving for [place] for 2 weeks. PCP is Dr [name].    Current Medications, Allergies, Past Medical History, Past Surgical History, Family History and Social History were reviewed in [employer] record.     Review of Systems:  Patient denies any headaches, hearing loss, fatigue, blurred vision, shortness of breath, chest pain, abdominal pain, problems with bowel movements, urination, or intercourse(not having sex). No joint pain or mood swings.   Physical Exam:BP 136/77 (BP Location: Left Arm, Patient Position: Sitting, Cuff Size: Normal)   Pulse 80   Ht 5\' 3"  (1.6 m)   Wt 178 lb (80.7 kg)   BMI 31.53 kg/m  General:  Well developed, well nourished, no acute distress Skin:  Warm and dry Neck:  Midline trachea, normal thyroid, good ROM, no lymphadenopathy,no carotid bruits heard  Lungs; Clear to auscultation bilaterally Breast:  No dominant palpable mass, retraction, or nipple discharge Cardiovascular: Regular rate and rhythm Abdomen:  Soft, non tender, no hepatosplenomegaly Pelvic:  External genitalia is normal in appearance, no lesions.  The vagina is pale with loss of rugae and moisture. Urethra has no lesions or masses. The cervix is smooth.  Uterus is felt to be normal size, shape, and contour.  No adnexal masses or tenderness noted.Bladder is non tender, no masses felt. Rectal is deferred at her request. Extremities/musculoskeletal:  No swelling or varicosities noted, no clubbing or cyanosis Psych:  No mood changes, alert and cooperative,seems happy PHQ 9 score 1.She is only taking 1/2 5 mg buspar now and it is working, no more tears. Examination chaperoned by Diona Fanti, CMA.  Impression: 1. Encounter for  well woman exam with routine gynecological exam   2. Anxiety       Plan: Mammogram yearly Labs with PCP No more paps  Follow up prn

## 2018-08-06 DIAGNOSIS — H40003 Preglaucoma, unspecified, bilateral: Secondary | ICD-10-CM | POA: Diagnosis not present

## 2018-08-06 DIAGNOSIS — H04123 Dry eye syndrome of bilateral lacrimal glands: Secondary | ICD-10-CM | POA: Diagnosis not present

## 2018-08-06 DIAGNOSIS — I10 Essential (primary) hypertension: Secondary | ICD-10-CM | POA: Diagnosis not present

## 2018-08-10 DIAGNOSIS — I10 Essential (primary) hypertension: Secondary | ICD-10-CM | POA: Diagnosis not present

## 2018-08-10 DIAGNOSIS — Z6831 Body mass index (BMI) 31.0-31.9, adult: Secondary | ICD-10-CM | POA: Diagnosis not present

## 2018-08-10 DIAGNOSIS — H8149 Vertigo of central origin, unspecified ear: Secondary | ICD-10-CM | POA: Diagnosis not present

## 2018-08-10 DIAGNOSIS — Z0000 Encounter for general adult medical examination without abnormal findings: Secondary | ICD-10-CM | POA: Diagnosis not present

## 2018-08-22 DIAGNOSIS — Z23 Encounter for immunization: Secondary | ICD-10-CM | POA: Diagnosis not present

## 2018-09-28 ENCOUNTER — Other Ambulatory Visit: Payer: Self-pay

## 2018-10-10 IMAGING — MG DIGITAL SCREENING BILATERAL MAMMOGRAM WITH TOMO AND CAD
8 series · 8 of 24 positions shown · non-contrast
Comparison: Previous exam(s).

CLINICAL DATA: Screening.

EXAM:
DIGITAL SCREENING BILATERAL MAMMOGRAM WITH TOMO AND CAD

[R CC synth-2D]
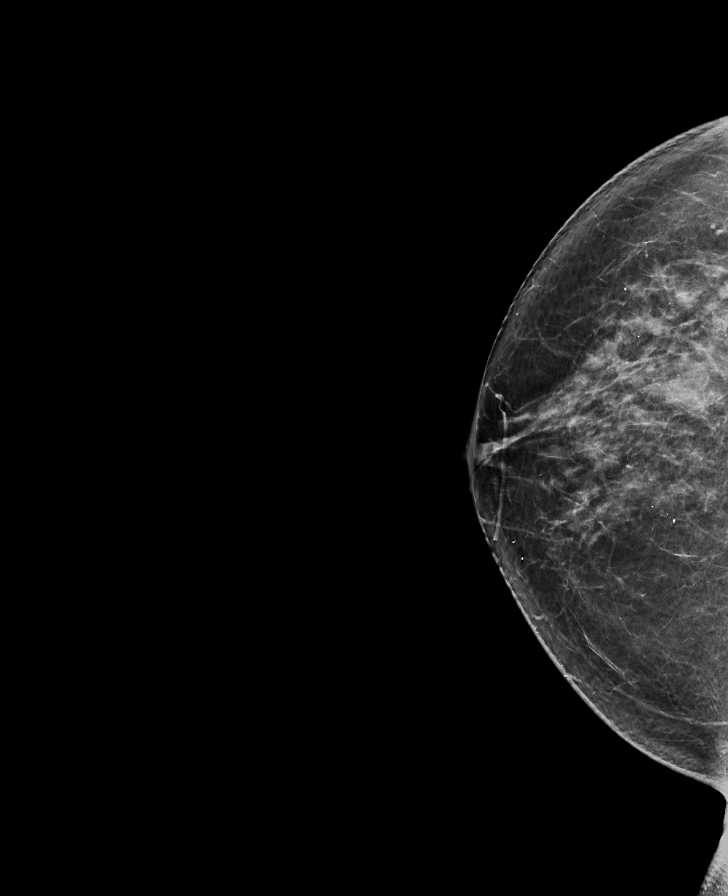

[R MLO synth-2D]
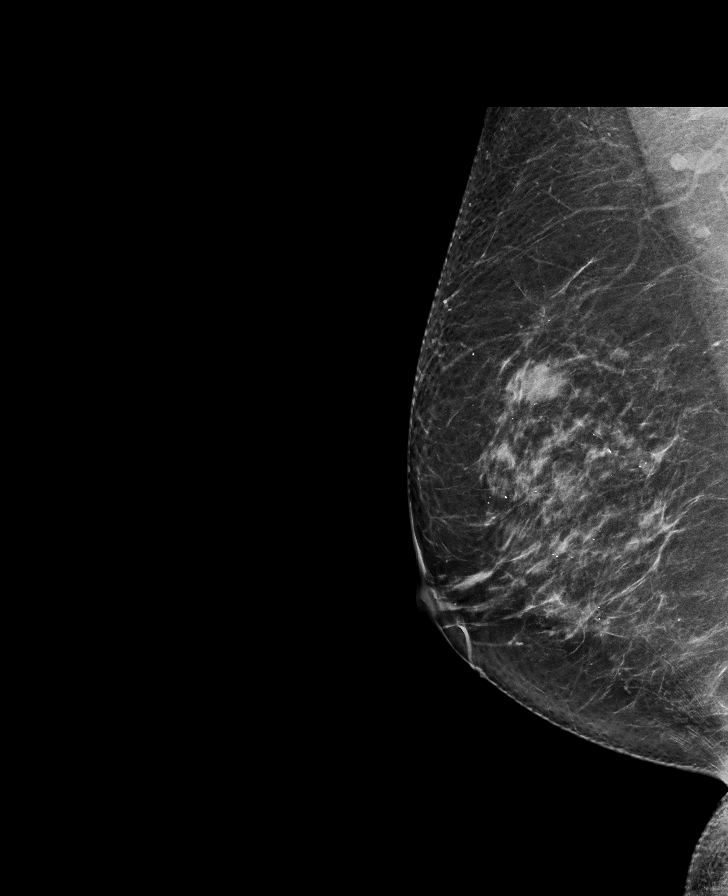

[L MLO synth-2D]
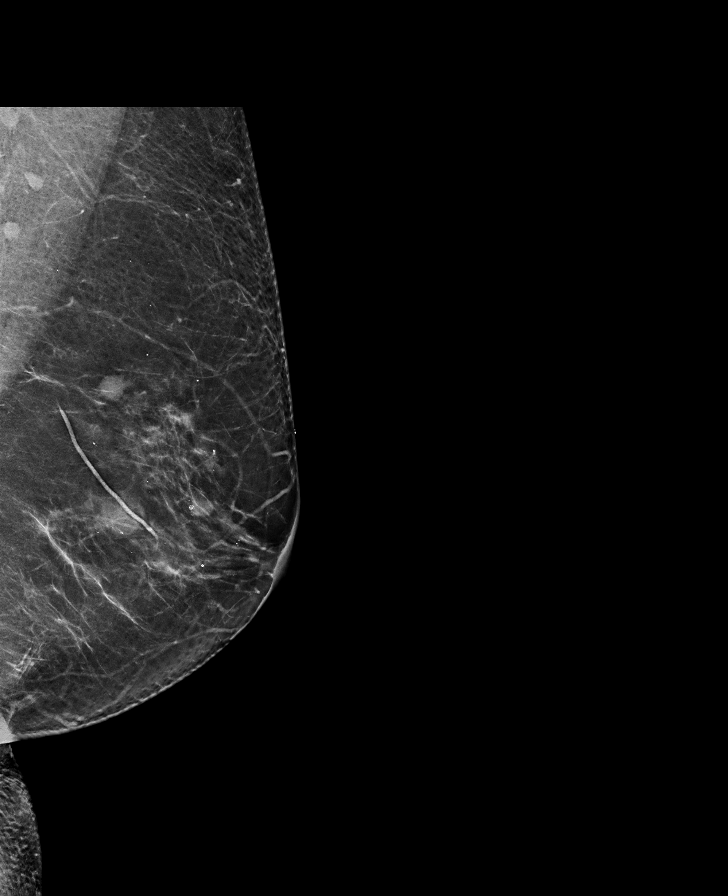

[L CC synth-2D]
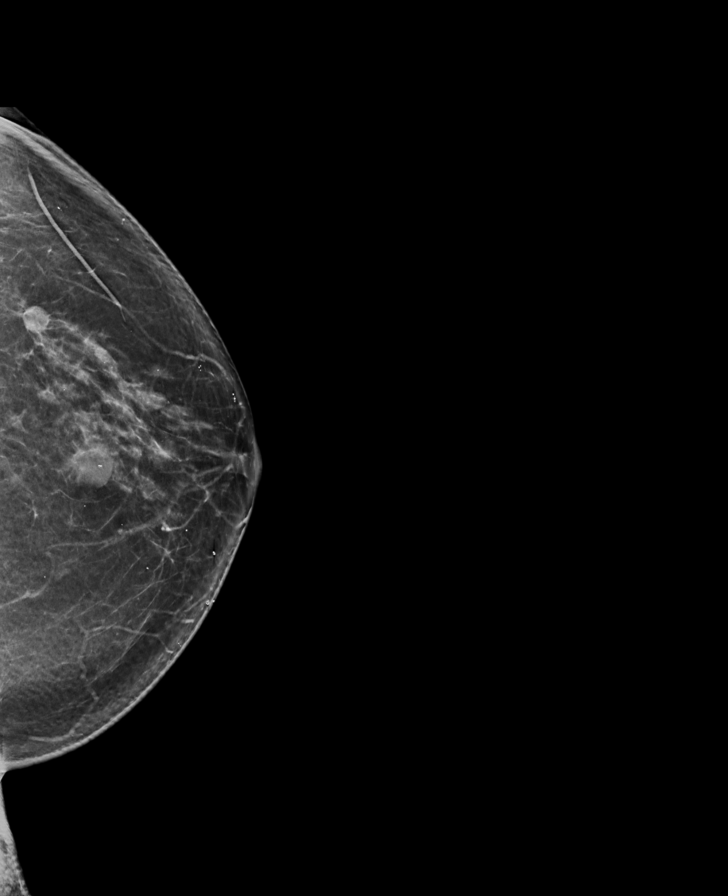

[R MLO tomo · tomo slice 41/80.0]
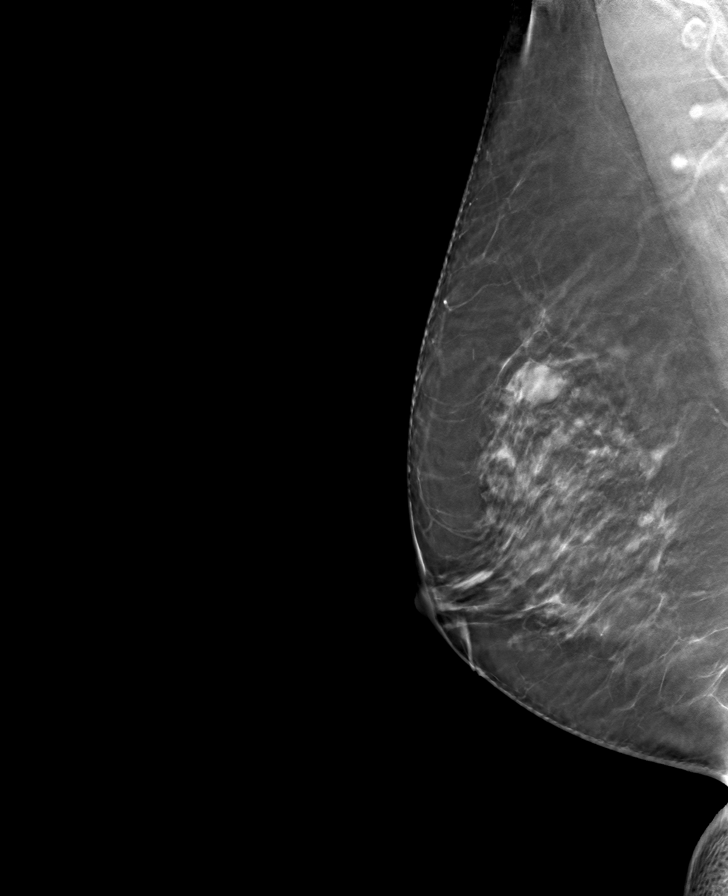

[L CC tomo · tomo slice 41/81.0]
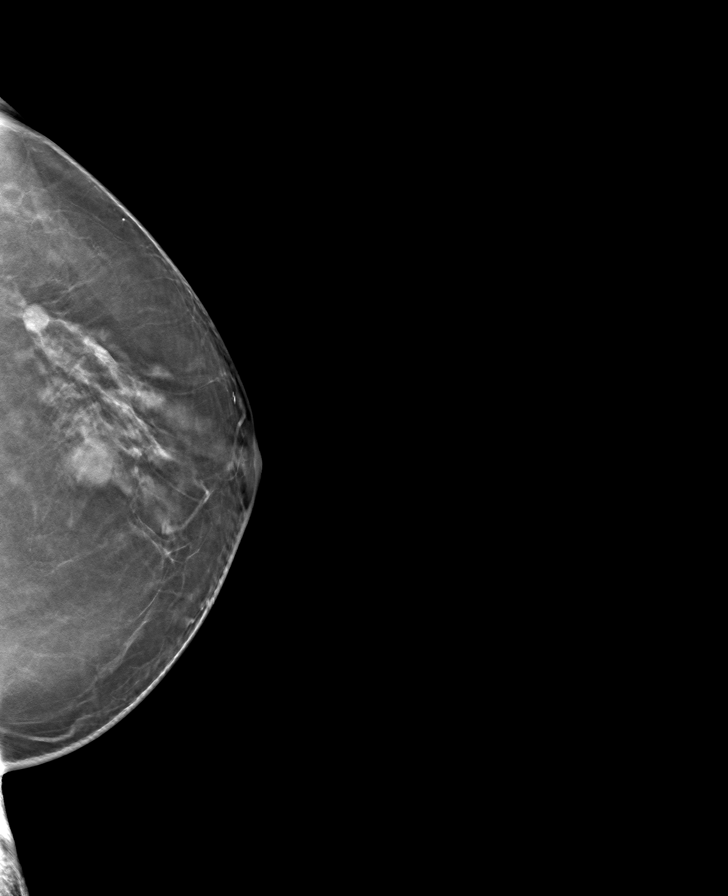

[R CC tomo · tomo slice 41/80.0]
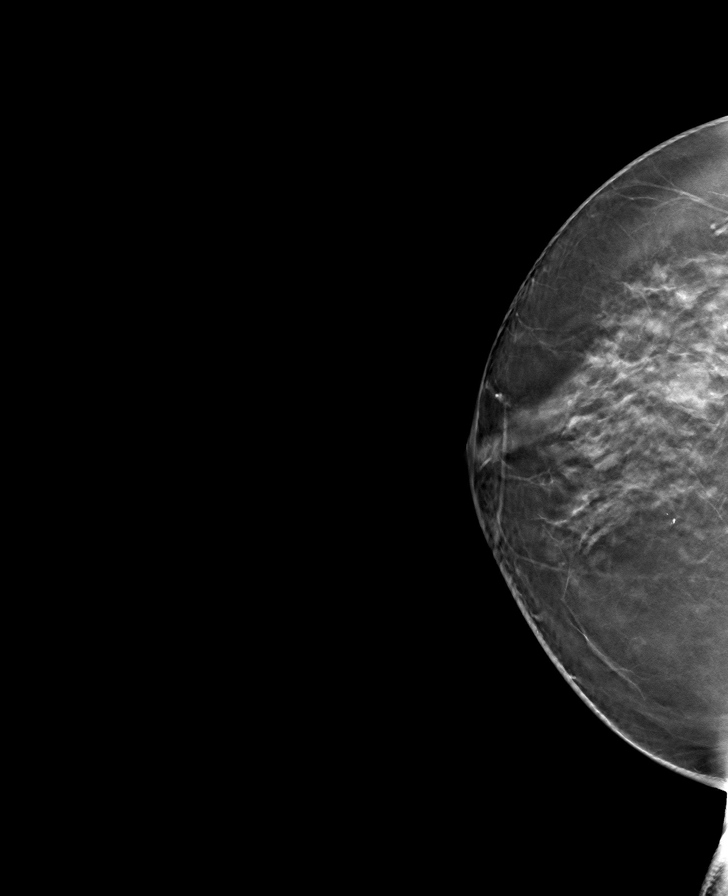

[L MLO tomo · tomo slice 39/77.0]
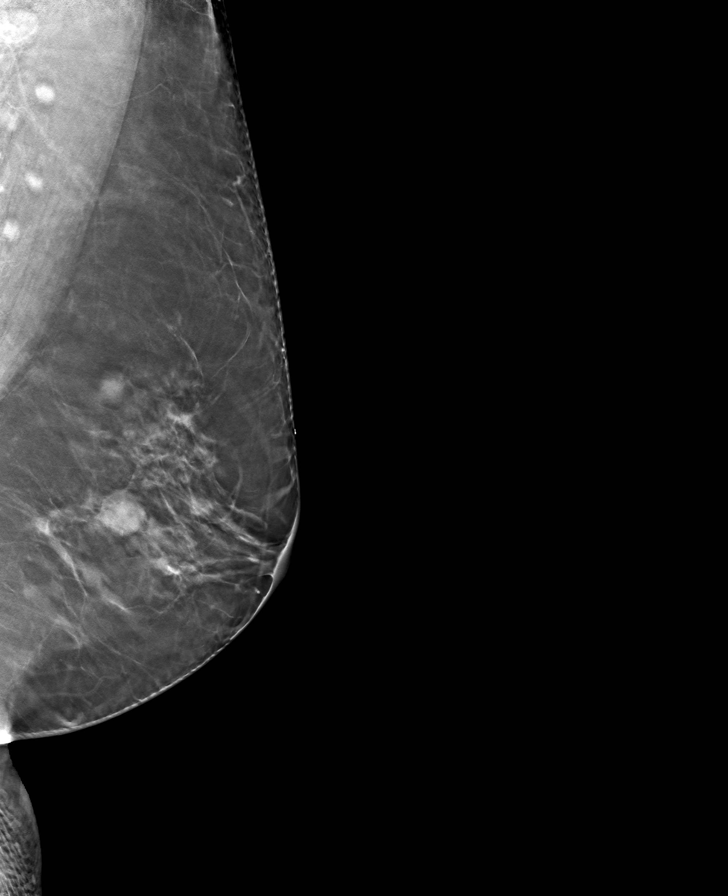

[8 of 24 positions shown; findings below may reference images not displayed]

ACR Breast Density Category b: There are scattered areas of
fibroglandular density.
FINDINGS: There are no findings suspicious for malignancy. Images were
processed with CAD.
IMPRESSION: No mammographic evidence of malignancy. A result letter of this
screening mammogram will be mailed directly to the patient.

RECOMMENDATION:
Screening mammogram in one year. (Code:[id number])

BI-RADS CATEGORY  1: Negative.

## 2019-01-02 DIAGNOSIS — D485 Neoplasm of uncertain behavior of skin: Secondary | ICD-10-CM | POA: Diagnosis not present

## 2019-01-02 DIAGNOSIS — D225 Melanocytic nevi of trunk: Secondary | ICD-10-CM | POA: Diagnosis not present

## 2019-01-02 DIAGNOSIS — Z1283 Encounter for screening for malignant neoplasm of skin: Secondary | ICD-10-CM | POA: Diagnosis not present

## 2019-01-02 DIAGNOSIS — L989 Disorder of the skin and subcutaneous tissue, unspecified: Secondary | ICD-10-CM | POA: Diagnosis not present

## 2019-01-02 DIAGNOSIS — D2271 Melanocytic nevi of right lower limb, including hip: Secondary | ICD-10-CM | POA: Diagnosis not present

## 2019-01-09 DIAGNOSIS — D485 Neoplasm of uncertain behavior of skin: Secondary | ICD-10-CM | POA: Diagnosis not present

## 2019-01-09 DIAGNOSIS — L989 Disorder of the skin and subcutaneous tissue, unspecified: Secondary | ICD-10-CM | POA: Diagnosis not present

## 2019-01-16 DIAGNOSIS — Z23 Encounter for immunization: Secondary | ICD-10-CM | POA: Diagnosis not present

## 2019-02-11 DIAGNOSIS — G4700 Insomnia, unspecified: Secondary | ICD-10-CM | POA: Diagnosis not present

## 2019-02-11 DIAGNOSIS — I10 Essential (primary) hypertension: Secondary | ICD-10-CM | POA: Diagnosis not present

## 2019-02-11 DIAGNOSIS — F419 Anxiety disorder, unspecified: Secondary | ICD-10-CM | POA: Diagnosis not present

## 2019-02-26 ENCOUNTER — Other Ambulatory Visit: Payer: Self-pay | Admitting: Adult Health

## 2019-02-26 DIAGNOSIS — Z1231 Encounter for screening mammogram for malignant neoplasm of breast: Secondary | ICD-10-CM

## 2019-03-11 ENCOUNTER — Encounter (INDEPENDENT_AMBULATORY_CARE_PROVIDER_SITE_OTHER): Payer: Self-pay | Admitting: *Deleted

## 2019-05-02 ENCOUNTER — Ambulatory Visit
Admission: RE | Admit: 2019-05-02 | Discharge: 2019-05-02 | Disposition: A | Discharge status: Home / Self Care | Payer: Medicare Other | Source: Ambulatory Visit | Attending: Adult Health | Admitting: Adult Health

## 2019-05-02 ENCOUNTER — Other Ambulatory Visit: Payer: Self-pay

## 2019-05-02 DIAGNOSIS — Z1231 Encounter for screening mammogram for malignant neoplasm of breast: Secondary | ICD-10-CM | POA: Diagnosis not present

## 2019-05-03 ENCOUNTER — Other Ambulatory Visit: Payer: Self-pay | Admitting: Adult Health

## 2019-05-03 DIAGNOSIS — R928 Other abnormal and inconclusive findings on diagnostic imaging of breast: Secondary | ICD-10-CM

## 2019-05-08 ENCOUNTER — Ambulatory Visit
Admission: RE | Admit: 2019-05-08 | Discharge: 2019-05-08 | Disposition: A | Discharge status: Home / Self Care | Payer: Medicare Other | Source: Ambulatory Visit | Attending: Adult Health | Admitting: Adult Health

## 2019-05-08 DIAGNOSIS — R928 Other abnormal and inconclusive findings on diagnostic imaging of breast: Secondary | ICD-10-CM

## 2019-05-08 DIAGNOSIS — N6002 Solitary cyst of left breast: Secondary | ICD-10-CM | POA: Diagnosis not present

## 2019-05-22 ENCOUNTER — Telehealth (INDEPENDENT_AMBULATORY_CARE_PROVIDER_SITE_OTHER): Payer: Self-pay | Admitting: *Deleted

## 2019-05-22 NOTE — Telephone Encounter (Signed)
Referring MD/PCP: hall   Procedure: tcs  Reason/Indication:  Hx polyps  Has patient had this procedure before?  Yes, 03/2014  If so, when, by whom and where?    Is there a family history of colon cancer?  no  Who?  What age when diagnosed?    Is patient diabetic?   no      Does patient have prosthetic heart valve or mechanical valve?  no  Do you have a pacemaker?  no  Has patient ever had endocarditis? no  Has patient had joint replacement within last 12 months?  no  Is patient constipated or do they take laxatives? no  Does patient have a history of alcohol/drug use?  no  Is patient on blood thinner such as Coumadin, Plavix and/or Aspirin? no  Medications: losartan 25 mg daily, levocetirizine 5 mg prn  Allergies: nkda  Medication Adjustment per Dr Lindi Adie, NP:   Procedure date & time:

## 2019-08-23 DIAGNOSIS — Z23 Encounter for immunization: Secondary | ICD-10-CM | POA: Diagnosis not present

## 2019-09-18 ENCOUNTER — Other Ambulatory Visit (INDEPENDENT_AMBULATORY_CARE_PROVIDER_SITE_OTHER): Payer: Self-pay | Admitting: *Deleted

## 2019-09-18 DIAGNOSIS — Z86010 Personal history of colonic polyps: Secondary | ICD-10-CM

## 2019-10-17 ENCOUNTER — Telehealth (INDEPENDENT_AMBULATORY_CARE_PROVIDER_SITE_OTHER): Payer: Self-pay | Admitting: *Deleted

## 2019-10-17 ENCOUNTER — Encounter (INDEPENDENT_AMBULATORY_CARE_PROVIDER_SITE_OTHER): Payer: Self-pay | Admitting: *Deleted

## 2019-10-17 ENCOUNTER — Other Ambulatory Visit (INDEPENDENT_AMBULATORY_CARE_PROVIDER_SITE_OTHER): Payer: Self-pay | Admitting: *Deleted

## 2019-10-17 DIAGNOSIS — Z86010 Personal history of colonic polyps: Secondary | ICD-10-CM

## 2019-10-17 MED ORDER — PEG 3350-KCL-NA BICARB-NACL 420 G PO SOLR: 4000.0000 mL | Freq: Once | ORAL | 0 refills | Status: AC

## 2019-10-17 NOTE — Telephone Encounter (Signed)
Patient needs trilyte TCS sch'd 1/27

## 2019-10-24 ENCOUNTER — Encounter (INDEPENDENT_AMBULATORY_CARE_PROVIDER_SITE_OTHER): Payer: Self-pay | Admitting: *Deleted

## 2019-11-11 ENCOUNTER — Telehealth (INDEPENDENT_AMBULATORY_CARE_PROVIDER_SITE_OTHER): Payer: Self-pay | Admitting: *Deleted

## 2019-11-11 ENCOUNTER — Other Ambulatory Visit: Payer: Self-pay

## 2019-11-11 ENCOUNTER — Ambulatory Visit (INDEPENDENT_AMBULATORY_CARE_PROVIDER_SITE_OTHER): Payer: Self-pay

## 2019-11-11 NOTE — Telephone Encounter (Signed)
Referring MD/PCP: hall   Procedure: tcs  Reason/Indication:  Hx polyps  Has patient had this procedure before?  Yes, 03/2014             If so, when, by whom and where?    Is there a family history of colon cancer?  no             Who?  What age when diagnosed?    Is patient diabetic?   no                                                  Does patient have prosthetic heart valve or mechanical valve?  no  Do you have a pacemaker?  no  Has patient ever had endocarditis? no  Has patient had joint replacement within last 12 months?  no  Is patient constipated or do they take laxatives? no  Does patient have a history of alcohol/drug use?  no  Is patient on blood thinner such as Coumadin, Plavix and/or Aspirin? no  Medications: losartan 25 mg daily, levocetirizine 5 mg prn  Allergies: nkda  Medication Adjustment per Dr Lindi Adie, NP:   Procedure date & time: 12/11/19 at 930

## 2019-11-25 NOTE — Telephone Encounter (Signed)
Colonoscopy with conscious sedation 

## 2019-11-27 DIAGNOSIS — Z23 Encounter for immunization: Secondary | ICD-10-CM | POA: Diagnosis not present

## 2019-12-09 ENCOUNTER — Other Ambulatory Visit (HOSPITAL_COMMUNITY): Payer: Medicare Other

## 2019-12-11 ENCOUNTER — Other Ambulatory Visit (INDEPENDENT_AMBULATORY_CARE_PROVIDER_SITE_OTHER): Payer: Self-pay | Admitting: *Deleted

## 2019-12-11 ENCOUNTER — Encounter (HOSPITAL_COMMUNITY): Payer: Self-pay

## 2019-12-11 ENCOUNTER — Ambulatory Visit (HOSPITAL_COMMUNITY): Admit: 2019-12-11 | Payer: Medicare Other | Admitting: Internal Medicine

## 2019-12-11 DIAGNOSIS — Z86010 Personal history of colonic polyps: Secondary | ICD-10-CM

## 2019-12-11 SURGERY — COLONOSCOPY
Anesthesia: Moderate Sedation

## 2019-12-18 ENCOUNTER — Other Ambulatory Visit (HOSPITAL_COMMUNITY)
Admission: RE | Admit: 2019-12-18 | Discharge: 2019-12-18 | Disposition: A | Discharge status: Home / Self Care | Payer: Medicare Other | Source: Ambulatory Visit | Attending: Internal Medicine | Admitting: Internal Medicine

## 2019-12-18 ENCOUNTER — Other Ambulatory Visit: Payer: Self-pay

## 2019-12-18 DIAGNOSIS — Z20822 Contact with and (suspected) exposure to covid-19: Secondary | ICD-10-CM | POA: Insufficient documentation

## 2019-12-18 DIAGNOSIS — Z01812 Encounter for preprocedural laboratory examination: Secondary | ICD-10-CM | POA: Diagnosis not present

## 2019-12-18 LAB — SARS CORONAVIRUS 2 (TAT 6-24 HRS): SARS Coronavirus 2: NEGATIVE

## 2019-12-20 ENCOUNTER — Other Ambulatory Visit: Payer: Self-pay

## 2019-12-20 ENCOUNTER — Ambulatory Visit (HOSPITAL_COMMUNITY)
Admission: RE | Admit: 2019-12-20 | Discharge: 2019-12-20 | Disposition: A | Discharge status: Home / Self Care | Payer: Medicare Other | Attending: Internal Medicine | Admitting: Internal Medicine

## 2019-12-20 ENCOUNTER — Encounter (HOSPITAL_COMMUNITY): Payer: Self-pay | Admitting: Internal Medicine

## 2019-12-20 ENCOUNTER — Encounter (HOSPITAL_COMMUNITY)
Admission: RE | Disposition: A | Discharge status: Home / Self Care | Payer: Self-pay | Source: Home / Self Care | Attending: Internal Medicine

## 2019-12-20 DIAGNOSIS — Z1211 Encounter for screening for malignant neoplasm of colon: Secondary | ICD-10-CM | POA: Insufficient documentation

## 2019-12-20 DIAGNOSIS — Z86010 Personal history of colonic polyps: Secondary | ICD-10-CM | POA: Insufficient documentation

## 2019-12-20 DIAGNOSIS — Z79899 Other long term (current) drug therapy: Secondary | ICD-10-CM | POA: Diagnosis not present

## 2019-12-20 DIAGNOSIS — K6289 Other specified diseases of anus and rectum: Secondary | ICD-10-CM | POA: Diagnosis not present

## 2019-12-20 DIAGNOSIS — F419 Anxiety disorder, unspecified: Secondary | ICD-10-CM | POA: Diagnosis not present

## 2019-12-20 DIAGNOSIS — Z8249 Family history of ischemic heart disease and other diseases of the circulatory system: Secondary | ICD-10-CM | POA: Insufficient documentation

## 2019-12-20 DIAGNOSIS — I10 Essential (primary) hypertension: Secondary | ICD-10-CM | POA: Diagnosis not present

## 2019-12-20 DIAGNOSIS — D120 Benign neoplasm of cecum: Secondary | ICD-10-CM | POA: Diagnosis not present

## 2019-12-20 DIAGNOSIS — Z87891 Personal history of nicotine dependence: Secondary | ICD-10-CM | POA: Insufficient documentation

## 2019-12-20 DIAGNOSIS — D123 Benign neoplasm of transverse colon: Secondary | ICD-10-CM | POA: Diagnosis not present

## 2019-12-20 DIAGNOSIS — Z09 Encounter for follow-up examination after completed treatment for conditions other than malignant neoplasm: Secondary | ICD-10-CM | POA: Diagnosis not present

## 2019-12-20 HISTORY — PX: POLYPECTOMY: SHX5525

## 2019-12-20 HISTORY — PX: COLONOSCOPY: SHX5424

## 2019-12-20 SURGERY — COLONOSCOPY
Anesthesia: Moderate Sedation

## 2019-12-20 MED ORDER — SODIUM CHLORIDE 0.9 % IV SOLN: INTRAVENOUS | Status: DC

## 2019-12-20 MED ORDER — MIDAZOLAM HCL 5 MG/5ML IJ SOLN
INTRAMUSCULAR | Status: DC | PRN
  Administered 2019-12-20 (×2): 2 mg via INTRAVENOUS
  Administered 2019-12-20: 1 mg via INTRAVENOUS

## 2019-12-20 MED ORDER — MEPERIDINE HCL 50 MG/ML IJ SOLN
INTRAMUSCULAR | Status: DC | PRN
  Administered 2019-12-20 (×2): 25 mg

## 2019-12-20 MED ORDER — MEPERIDINE HCL 50 MG/ML IJ SOLN
INTRAMUSCULAR | Status: AC
  Filled 2019-12-20: qty 1

## 2019-12-20 MED ORDER — STERILE WATER FOR IRRIGATION IR SOLN: Status: DC | PRN

## 2019-12-20 MED ORDER — MIDAZOLAM HCL 5 MG/5ML IJ SOLN
INTRAMUSCULAR | Status: AC
  Filled 2019-12-20: qty 10

## 2019-12-20 NOTE — Op Note (Signed)
[facility] Patient Name: [name] Procedure Date: 12/20/2019 10:08 AM MRN: [id number] Date of Birth: 07/02/1941 Attending MD: Hildred Laser , MD CSN: [id number] Age: 79 Admit Type: Outpatient Procedure:                Colonoscopy Indications:              High risk colon cancer surveillance: Personal                            history of colonic polyps Providers:                Hildred Laser, MD, Janeece Riggers, RN, Aram Candela Referring MD:             Delphina Cahill, MD Medicines:                Midazolam 5 mg IV, Meperidine 50 mg IV Complications:            No immediate complications. Estimated Blood Loss:     Estimated blood loss was minimal. Procedure:                Pre-Anesthesia Assessment:                           - Prior to the procedure, a History and Physical                            was performed, and patient medications and                            allergies were reviewed. The patient's tolerance of                            previous anesthesia was also reviewed. The risks                            and benefits of the procedure and the sedation                            options and risks were discussed with the patient.                            All questions were answered, and informed consent                            was obtained. Prior Anticoagulants: The patient has                            taken no previous anticoagulant or antiplatelet                            agents. ASA Grade Assessment: I - A normal, healthy                            patient. After reviewing the risks and benefits,  the patient was deemed in satisfactory condition to                            undergo the procedure.                           After obtaining informed consent, the colonoscope                            was passed under direct vision. Throughout the                            procedure, the patient's blood pressure, pulse, and                   oxygen saturations were monitored continuously. The                            [id number] scope was introduced through                            the anus and advanced to the the cecum, identified                            by appendiceal orifice and ileocecal valve. The                            colonoscopy was performed without difficulty. The                            patient tolerated the procedure well. The quality                            of the bowel preparation was excellent except the                            ascending colon was fair. The ileocecal valve,                            appendiceal orifice, and rectum were photographed. Scope In: 10:49:57 AM Scope Out: 11:14:37 AM Scope Withdrawal Time: 0 hours 20 minutes 31 seconds  Total Procedure Duration: 0 hours 24 minutes 40 seconds  Findings:      The perianal and digital rectal examinations were normal.      Two sessile polyps were found in the cecum. The polyps were 6 mm in       size. These polyps were removed with a cold snare. Resection and       retrieval were complete. The pathology specimen was placed into Bottle       Number 1.      Two polyps were found in the transverse colon. The polyps were small in       size. These were biopsied with a cold forceps for histology. The       pathology specimen was placed into Bottle Number 1.      Anal papilla(e) were hypertrophied. Impression:               -  Two 6 mm polyps in the cecum, removed with a cold                            snare. Resected and retrieved.                           - Two small polyps in the transverse colon.                            Biopsied.                           - Anal papilla(e) were hypertrophied. Moderate Sedation:      Moderate (conscious) sedation was administered by the endoscopy nurse       and supervised by the endoscopist. The following parameters were       monitored: oxygen saturation, heart rate, blood  pressure, CO2       capnography and response to care. Total physician intraservice time was       30 minutes. Recommendation:           - Patient has a contact number available for                            emergencies. The signs and symptoms of potential                            delayed complications were discussed with the                            patient. Return to normal activities tomorrow.                            Written discharge instructions were provided to the                            patient.                           - Resume previous diet today.                           - Continue present medications.                           - No aspirin, ibuprofen, naproxen, or other                            non-steroidal anti-inflammatory drugs for 3 days.                           - Await pathology results.                           - Repeat colonoscopy in 5 years for surveillance. Procedure Code(s):        --- Professional ---  [phone], Colonoscopy, flexible; with removal of                            tumor(s), polyp(s), or other lesion(s) by snare                            technique                           45380, 59, Colonoscopy, flexible; with biopsy,                            single or multiple                           99153, Moderate sedation; each additional 15                            minutes intraservice time                           G0500, Moderate sedation services provided by the                            same physician or other qualified health care                            professional performing a gastrointestinal                            endoscopic service that sedation supports,                            requiring the presence of an independent trained                            observer to assist in the monitoring of the                            patient's level of consciousness and physiological                             status; initial 15 minutes of intra-service time;                            patient age 55 years or older (additional time may                            be reported with [phone], as appropriate) Diagnosis Code(s):        --- Professional ---                           Z86.010, Personal history of colonic polyps                           K63.5, Polyp  of colon                           K62.89, Other specified diseases of anus and rectum CPT copyright 2019 American Medical Association. All rights reserved. The codes documented in this report are preliminary and upon coder review may  be revised to meet current compliance requirements. Hildred Laser, MD Hildred Laser, MD 12/20/2019 11:24:18 AM This report has been signed electronically. Number of Addenda: 0

## 2019-12-20 NOTE — Discharge Instructions (Signed)
No aspirin or NSAIDs for 3 days. Resume usual medications and diet as before. No driving for 24 hours Physician will call with biopsy results.   Colonoscopy, Adult, Care After This sheet gives you information about how to care for yourself after your procedure. Your doctor may also give you more specific instructions. If you have problems or questions, call your doctor. What can I expect after the procedure? After the procedure, it is common to have:  A small amount of blood in your poop (stool) for 24 hours.  Some gas.  Mild cramping or bloating in your belly (abdomen). Follow these instructions at home: Eating and drinking   Drink enough fluid to keep your pee (urine) pale yellow.  Follow instructions from your doctor about what you cannot eat or drink.  Return to your normal diet as told by your doctor. Avoid heavy or fried foods that are hard to digest. Activity  Rest as told by your doctor.  Do not sit for a long time without moving. Get up to take short walks every 1-2 hours. This is important. Ask for help if you feel weak or unsteady.  Return to your normal activities as told by your doctor. Ask your doctor what activities are safe for you. To help cramping and bloating:   Try walking around.  Put heat on your belly as told by your doctor. Use the heat source that your doctor recommends, such as a moist heat pack or a heating pad. ? Put a towel between your skin and the heat source. ? Leave the heat on for 20-30 minutes. ? Remove the heat if your skin turns bright red. This is very important if you are unable to feel pain, heat, or cold. You may have a greater risk of getting burned. General instructions  For the first 24 hours after the procedure: ? Do not drive or use machinery. ? Do not sign important documents. ? Do not drink alcohol. ? Do your daily activities more slowly than normal. ? Eat foods that are soft and easy to digest.  Take over-the-counter  or prescription medicines only as told by your doctor.  Keep all follow-up visits as told by your doctor. This is important. Contact a doctor if:  You have blood in your poop 2-3 days after the procedure. Get help right away if:  You have more than a small amount of blood in your poop.  You see large clumps of tissue (blood clots) in your poop.  Your belly is swollen.  You feel like you may vomit (nauseous).  You vomit.  You have a fever.  You have belly pain that gets worse, and medicine does not help your pain. Summary  After the procedure, it is common to have a small amount of blood in your poop. You may also have mild cramping and bloating in your belly.  For the first 24 hours after the procedure, do not drive or use machinery, do not sign important documents, and do not drink alcohol.  Get help right away if you have a lot of blood in your poop, feel like you may vomit, have a fever, or have more belly pain. This information is not intended to replace advice given to you by your health care provider. Make sure you discuss any questions you have with your health care provider. Document Revised: 05/27/2019 Document Reviewed: 05/27/2019 Elsevier Patient Education  [place].  Colon Polyps  Polyps are tissue growths inside the body. Polyps can  grow in many places, including the large intestine (colon). A polyp may be a round bump or a mushroom-shaped growth. You could have one polyp or several. Most colon polyps are noncancerous (benign). However, some colon polyps can become cancerous over time. Finding and removing the polyps early can help prevent this. What are the causes? The exact cause of colon polyps is not known. What increases the risk? You are more likely to develop this condition if you:  Have a family history of colon cancer or colon polyps.  Are older than 38 or older than 45 if you are African American.  Have inflammatory bowel disease, such as  ulcerative colitis or Crohn's disease.  Have certain hereditary conditions, such as: ? Familial adenomatous polyposis. ? Lynch syndrome. ? Turcot syndrome. ? Peutz-Jeghers syndrome.  Are overweight.  Smoke cigarettes.  Do not get enough exercise.  Drink too much alcohol.  Eat a diet that is high in fat and red meat and low in fiber.  Had childhood cancer that was treated with abdominal radiation. What are the signs or symptoms? Most polyps do not cause symptoms. If you have symptoms, they may include:  Blood coming from your rectum when having a bowel movement.  Blood in your stool. The stool may look dark red or black.  Abdominal pain.  A change in bowel habits, such as constipation or diarrhea. How is this diagnosed? This condition is diagnosed with a colonoscopy. This is a procedure in which a lighted, flexible scope is inserted into the anus and then passed into the colon to examine the area. Polyps are sometimes found when a colonoscopy is done as part of routine cancer screening tests. How is this treated? Treatment for this condition involves removing any polyps that are found. Most polyps can be removed during a colonoscopy. Those polyps will then be tested for cancer. Additional treatment may be needed depending on the results of testing. Follow these instructions at home: Lifestyle  Maintain a healthy weight, or lose weight if recommended by your health care provider.  Exercise every day or as told by your health care provider.  Do not use any products that contain nicotine or tobacco, such as cigarettes and e-cigarettes. If you need help quitting, ask your health care provider.  If you drink alcohol, limit how much you have: ? 0-1 drink a day for women. ? 0-2 drinks a day for men.  Be aware of how much alcohol is in your drink. In the [place] one drink equals one 12 oz bottle of beer (355 mL), one 5 oz glass of wine (148 mL), or one 1 oz shot of hard liquor  (44 mL). Eating and drinking   Eat foods that are high in fiber, such as fruits, vegetables, and whole grains.  Eat foods that are high in calcium and vitamin D, such as milk, cheese, yogurt, eggs, liver, fish, and broccoli.  Limit foods that are high in fat, such as fried foods and desserts.  Limit the amount of red meat and processed meat you eat, such as hot dogs, sausage, bacon, and lunch meats. General instructions  Keep all follow-up visits as told by your health care provider. This is important. ? This includes having regularly scheduled colonoscopies. ? Talk to your health care provider about when you need a colonoscopy. Contact a health care provider if:  You have new or worsening bleeding during a bowel movement.  You have new or increased blood in your stool.  You have  a change in bowel habits.  You lose weight for no known reason. Summary  Polyps are tissue growths inside the body. Polyps can grow in many places, including the colon.  Most colon polyps are noncancerous (benign), but some can become cancerous over time.  This condition is diagnosed with a colonoscopy.  Treatment for this condition involves removing any polyps that are found. Most polyps can be removed during a colonoscopy. This information is not intended to replace advice given to you by your health care provider. Make sure you discuss any questions you have with your health care provider. Document Revised: 02/15/2018 Document Reviewed: 02/15/2018 Elsevier Patient Education  [place].

## 2019-12-20 NOTE — H&P (Signed)
[name] is an 79 y.o. female.   Chief Complaint: Patient is here for colonoscopy. HPI: Patient is 79 year old Caucasian female who has a history of colonic polyps and is here for surveillance colonoscopy.  Last exam was in May 2015 with removal of 2 small polyps.  One was a tubular adenoma and the other polyp was sessile serrated polyp.  She has no GI complaints.  She denies abdominal pain change in bowel habits or rectal bleeding.  She does not take OTC NSAIDs. Family history is negative for CRC.  Past Medical History:  Diagnosis Date  . Breast mass, left 08/24/2015  . Hypertension   . Positive fecal occult blood test 02/20/2014   Will send 3 cards home    Past Surgical History:  Procedure Laterality Date  . BREAST CYST EXCISION    . BREAST EXCISIONAL BIOPSY Bilateral >10+ years ago   benign  . BREAST SURGERY Left    partail mastectomy-benign  . CATARACT EXTRACTION, BILATERAL     [place]  . CHOLECYSTECTOMY N/A 11/29/2013   Procedure: LAPAROSCOPIC CHOLECYSTECTOMY;  Surgeon: Scherry Ran, MD;  Location: [facility] ORS;  Service: General;  Laterality: N/A;  . COLONOSCOPY N/A 03/14/2014   Procedure: COLONOSCOPY;  Surgeon: Rogene Houston, MD;  Location: [facility] SUITE;  Service: Endoscopy;  Laterality: N/A;  1250  . TONSILLECTOMY      Family History  Problem Relation Age of Onset  . Cancer Mother        lung  . Cancer Father        lung  . Hypertension Father   . Cancer Sister        breast  . Heart disease Maternal Grandfather   . Heart attack Maternal Grandfather   . Heart disease Paternal Grandfather   . Heart attack Paternal Grandfather   . Cancer Other        breast   . Arthritis Sister        rheumatoid  . COPD Sister   . Breast cancer Sister 4   Social History:  reports that she quit smoking about 52 years ago. Her smoking use included cigarettes. She has a 2.00 pack-year smoking history. She has never used smokeless tobacco. She reports current alcohol use of  about 5.0 standard drinks of alcohol per week. She reports that she does not use drugs.  Allergies: No Known Allergies  Medications Prior to Admission  Medication Sig Dispense Refill  . Ascorbic Acid (VITAMIN C GUMMIE PO) Take 250 mg by mouth daily. Gummies    . busPIRone (BUSPAR) 5 MG tablet Take 1 tablet (5 mg total) by mouth 2 (two) times daily. (Patient taking differently: Take 5 mg by mouth 2 (two) times daily as needed (anxiety). ) 60 tablet 3  . Cholecalciferol (VITAMIN D3 GUMMIES PO) Take 2,000 Units by mouth daily. Gummies    . Zinc 50 MG TABS Take 25 mg by mouth daily.    . polyethylene glycol-electrolytes (NULYTELY) 420 g solution Take 4,000 mLs by mouth once.      No results found for this or any previous visit (from the past 48 hour(s)). No results found.  Review of Systems  Blood pressure (!) 154/85, pulse 81, temperature 99 F (37.2 C), temperature source Oral, resp. rate 17, height 5\' 3"  (1.6 m), weight 78.5 kg, SpO2 99 %. Physical Exam  Constitutional: She appears well-developed and well-nourished.  HENT:  Mouth/Throat: Oropharynx is clear and moist.  Eyes: Conjunctivae are normal. No scleral icterus.  Neck: No thyromegaly present.  Cardiovascular: Normal rate, regular rhythm and normal heart sounds.  No murmur heard. Respiratory: Effort normal and breath sounds normal.  GI: Soft. She exhibits no distension and no mass.  Musculoskeletal:        General: No edema.  Lymphadenopathy:    She has no cervical adenopathy.  Neurological: She is alert.  Skin: Skin is warm and dry.     Assessment/Plan History of colonic polyps. Surveillance colonoscopy.  Hildred Laser, MD 12/20/2019, 10:41 AM

## 2019-12-23 LAB — SURGICAL PATHOLOGY

## 2019-12-25 DIAGNOSIS — Z23 Encounter for immunization: Secondary | ICD-10-CM | POA: Diagnosis not present

## 2020-03-26 ENCOUNTER — Other Ambulatory Visit: Payer: Self-pay | Admitting: Adult Health

## 2020-03-26 DIAGNOSIS — Z1231 Encounter for screening mammogram for malignant neoplasm of breast: Secondary | ICD-10-CM

## 2020-05-11 ENCOUNTER — Other Ambulatory Visit: Payer: Self-pay

## 2020-05-11 ENCOUNTER — Ambulatory Visit
Admission: RE | Admit: 2020-05-11 | Discharge: 2020-05-11 | Disposition: A | Discharge status: Home / Self Care | Payer: Medicare Other | Source: Ambulatory Visit | Attending: Adult Health | Admitting: Adult Health

## 2020-05-11 DIAGNOSIS — Z1231 Encounter for screening mammogram for malignant neoplasm of breast: Secondary | ICD-10-CM | POA: Diagnosis not present

## 2020-05-13 ENCOUNTER — Other Ambulatory Visit: Payer: Self-pay | Admitting: Adult Health

## 2020-05-13 DIAGNOSIS — R928 Other abnormal and inconclusive findings on diagnostic imaging of breast: Secondary | ICD-10-CM

## 2020-05-15 ENCOUNTER — Ambulatory Visit
Admission: RE | Admit: 2020-05-15 | Discharge: 2020-05-15 | Disposition: A | Discharge status: Home / Self Care | Payer: Medicare Other | Source: Ambulatory Visit | Attending: Adult Health | Admitting: Adult Health

## 2020-05-15 ENCOUNTER — Other Ambulatory Visit: Payer: Self-pay

## 2020-05-15 ENCOUNTER — Other Ambulatory Visit: Payer: Self-pay | Admitting: Adult Health

## 2020-05-15 DIAGNOSIS — R928 Other abnormal and inconclusive findings on diagnostic imaging of breast: Secondary | ICD-10-CM

## 2020-05-15 DIAGNOSIS — N6311 Unspecified lump in the right breast, upper outer quadrant: Secondary | ICD-10-CM | POA: Diagnosis not present

## 2020-05-15 DIAGNOSIS — N6313 Unspecified lump in the right breast, lower outer quadrant: Secondary | ICD-10-CM | POA: Diagnosis not present

## 2020-07-14 DIAGNOSIS — L821 Other seborrheic keratosis: Secondary | ICD-10-CM | POA: Diagnosis not present

## 2020-07-14 DIAGNOSIS — Z1283 Encounter for screening for malignant neoplasm of skin: Secondary | ICD-10-CM | POA: Diagnosis not present

## 2020-07-30 ENCOUNTER — Ambulatory Visit: Payer: Medicare Other

## 2020-07-30 ENCOUNTER — Ambulatory Visit: Payer: Medicare Other | Attending: Internal Medicine

## 2020-07-30 DIAGNOSIS — Z23 Encounter for immunization: Secondary | ICD-10-CM

## 2020-07-30 NOTE — Progress Notes (Signed)
   Covid-[facility] Vaccination [facility]  Name:  [name]    MRN: [id number] DOB: 11-07-1941  07/30/2020  [name] was observed post Covid-19 immunization for 15 minutes without incident. She was provided with Vaccine Information Sheet and instruction to access the V-Safe system.   [name] was instructed to call 911 with any severe reactions post vaccine: Marland Kitchen Difficulty breathing  . Swelling of face and throat  . A fast heartbeat  . A bad rash all over body  . Dizziness and weakness

## 2020-09-14 DIAGNOSIS — Z23 Encounter for immunization: Secondary | ICD-10-CM | POA: Diagnosis not present

## 2020-11-16 ENCOUNTER — Other Ambulatory Visit: Payer: Medicare Other

## 2020-11-18 ENCOUNTER — Other Ambulatory Visit: Payer: Medicare Other

## 2021-01-01 ENCOUNTER — Ambulatory Visit
Admission: RE | Admit: 2021-01-01 | Discharge: 2021-01-01 | Disposition: A | Discharge status: Home / Self Care | Payer: Medicare Other | Source: Ambulatory Visit | Attending: Adult Health | Admitting: Adult Health

## 2021-01-01 ENCOUNTER — Other Ambulatory Visit: Payer: Self-pay

## 2021-01-01 ENCOUNTER — Other Ambulatory Visit: Payer: Self-pay | Admitting: Adult Health

## 2021-01-01 ENCOUNTER — Other Ambulatory Visit (HOSPITAL_COMMUNITY): Payer: Self-pay | Admitting: Diagnostic Radiology

## 2021-01-01 DIAGNOSIS — C50811 Malignant neoplasm of overlapping sites of right female breast: Secondary | ICD-10-CM | POA: Diagnosis not present

## 2021-01-01 DIAGNOSIS — R928 Other abnormal and inconclusive findings on diagnostic imaging of breast: Secondary | ICD-10-CM

## 2021-01-01 DIAGNOSIS — N6313 Unspecified lump in the right breast, lower outer quadrant: Secondary | ICD-10-CM | POA: Diagnosis not present

## 2021-01-01 DIAGNOSIS — N6311 Unspecified lump in the right breast, upper outer quadrant: Secondary | ICD-10-CM | POA: Diagnosis not present

## 2021-01-01 DIAGNOSIS — N6315 Unspecified lump in the right breast, overlapping quadrants: Secondary | ICD-10-CM | POA: Diagnosis not present

## 2021-01-04 ENCOUNTER — Other Ambulatory Visit: Payer: Self-pay | Admitting: Adult Health

## 2021-01-04 DIAGNOSIS — C50919 Malignant neoplasm of unspecified site of unspecified female breast: Secondary | ICD-10-CM

## 2021-01-05 ENCOUNTER — Encounter: Payer: Self-pay | Admitting: Adult Health

## 2021-01-05 DIAGNOSIS — C50919 Malignant neoplasm of unspecified site of unspecified female breast: Secondary | ICD-10-CM

## 2021-01-05 HISTORY — DX: Malignant neoplasm of unspecified site of unspecified female breast: C50.919

## 2021-01-07 ENCOUNTER — Ambulatory Visit
Admission: RE | Admit: 2021-01-07 | Discharge: 2021-01-07 | Disposition: A | Discharge status: Home / Self Care | Payer: Medicare Other | Source: Ambulatory Visit | Attending: Adult Health | Admitting: Adult Health

## 2021-01-07 ENCOUNTER — Telehealth: Payer: Self-pay | Admitting: Adult Health

## 2021-01-07 ENCOUNTER — Other Ambulatory Visit: Payer: Self-pay

## 2021-01-07 DIAGNOSIS — C50919 Malignant neoplasm of unspecified site of unspecified female breast: Secondary | ICD-10-CM

## 2021-01-07 DIAGNOSIS — R922 Inconclusive mammogram: Secondary | ICD-10-CM | POA: Diagnosis not present

## 2021-01-07 MED ORDER — HYDROXYZINE HCL 10 MG PO TABS: 10.0000 mg | ORAL_TABLET | Freq: Three times a day (TID) | ORAL | 0 refills | Status: DC | PRN

## 2021-01-07 MED ORDER — BUSPIRONE HCL 7.5 MG PO TABS: 7.5000 mg | ORAL_TABLET | Freq: Three times a day (TID) | ORAL | 3 refills | Status: DC

## 2021-01-07 NOTE — Telephone Encounter (Signed)
Has recently been diagnosed with breast cancer, and anxious,has started buspar, will rx buspar 7.5 mg 1 tid #90. With 3 refills. Will also rx vistaril 10 mg 1 every 8 hours prn Please call if you need anything

## 2021-01-12 ENCOUNTER — Ambulatory Visit: Payer: Self-pay | Admitting: General Surgery

## 2021-01-12 DIAGNOSIS — Z170 Estrogen receptor positive status [ER+]: Secondary | ICD-10-CM | POA: Diagnosis not present

## 2021-01-12 DIAGNOSIS — C50411 Malignant neoplasm of upper-outer quadrant of right female breast: Secondary | ICD-10-CM

## 2021-01-13 ENCOUNTER — Other Ambulatory Visit: Payer: Self-pay | Admitting: General Surgery

## 2021-01-13 DIAGNOSIS — C50411 Malignant neoplasm of upper-outer quadrant of right female breast: Secondary | ICD-10-CM

## 2021-01-14 ENCOUNTER — Telehealth: Payer: Self-pay | Admitting: Oncology

## 2021-01-14 NOTE — Telephone Encounter (Signed)
Received a new pt referral from Dr. Marlou Starks for new dx of breast cancer. [name] has been cld and scheduled to see Dr. Jana Hakim on 3/8 at 4pm w/llabs at 330pm. Pt aware to arrive 15 minutes early.

## 2021-01-15 ENCOUNTER — Other Ambulatory Visit: Payer: Self-pay

## 2021-01-15 DIAGNOSIS — N6320 Unspecified lump in the left breast, unspecified quadrant: Secondary | ICD-10-CM

## 2021-01-18 NOTE — Progress Notes (Signed)
[place]  [phone] [phone]     ID: [name] DOB: Nov 03, 1941  MR#: [id number]  YIF#:[id number]  Patient Care Team: Celene Squibb, MD as PCP - General (Internal Medicine) Estill Dooms, NP as Nurse Practitioner (Obstetrics and Gynecology) Jovita Kussmaul, MD as Consulting Physician (General Surgery) Mervin Ramires, Virgie Dad, MD as Consulting Physician (Oncology) Rogene Houston, MD as Consulting Physician (Gastroenterology) Register, Luetta Nutting, PA-C as Physician Assistant (Dermatology) Kyung Rudd, MD as Consulting Physician (Radiation Oncology) Chauncey Cruel, MD OTHER MD:  CHIEF COMPLAINT: Estrogen receptor positive breast cancer  CURRENT TREATMENT: Awaiting definitive treatment   HISTORY OF CURRENT ILLNESS: [name] had routine screening mammography on 05/11/2020 showing a possible abnormality in the right breast. She underwent right diagnostic mammography with tomography and right breast ultrasonography at [facility] on 05/15/2020 showing: breast density category B; probable benign 1.1 cm mass in right breast at 9 o'clock. A six-month follow up right breast ultrasound was recommended.  Repeat right breast ultrasound was performed on 01/01/2021 showing: mild interval increase in size and irregularity of previously demonstrated mass in right breast at 9 o'clock, now 1.3 cm; normal-appearing right axillary lymph nodes.  Accordingly on the same day, she proceeded to biopsy of the right breast area in question. The pathology from this procedure ([id number]) showed: ductal carcinoma, with differential including papillary carcinoma. Prognostic indicators significant for: estrogen receptor, 95% positive and progesterone receptor, 5% positive, both with strong staining intensity. Proliferation marker Ki67 at 15%. HER2 equivocal by immunohistochemistry (2+), but negative by fluorescent in situ hybridization with a signals ratio 1.38 and  number per cell 2.  Cancer Staging Malignant neoplasm of upper-outer quadrant of right breast in female, estrogen receptor positive ([place]) Staging form: Breast, AJCC 8th Edition - Clinical: cT1c, cN0, cM0, GX, ER+, PR+, HER2- - Signed by Chauncey Cruel, MD on 01/19/2021   The patient's subsequent history is as detailed below.   INTERVAL HISTORY: [name] was evaluated in the breast cancer clinic on 01/19/2021 accompanied by her husband [name]. Her case was also presented at the multidisciplinary breast cancer conference on 01/13/2021. At that time a preliminary plan was proposed: Breast conserving surgery with or without sentinel lymph node sampling, antiestrogens, genetics testing  She is scheduled to meet with Dr. Lisbeth Renshaw tomorrow, 01/20/2021, to discuss radiation therapy. She is also scheduled for right lumpectomy on 01/26/2021 under Dr. Marlou Starks.   REVIEW OF SYSTEMS: The patient denies unusual headaches, visual changes, nausea, vomiting, stiff neck, dizziness, or gait imbalance. There has been no cough, phlegm production, or pleurisy, no chest pain or pressure, and no change in bowel or bladder habits. The patient denies fever, rash, bleeding, unexplained fatigue or unexplained weight loss.  She does not exercise regularly aside from walking her dog and doing housework A detailed review of systems was otherwise entirely negative.   COVID 19 VACCINATION STATUS: fully vaccinated Levan Hurst), with booster 07/2020   PAST MEDICAL HISTORY: Past Medical History:  Diagnosis Date  . Breast cancer ([place]) 01/05/2021   12/2020 Ductal carcinoma right breast   . Breast mass, left 08/24/2015  . Hypertension   . Positive fecal occult blood test 02/20/2014   Will send 3 cards home    PAST SURGICAL HISTORY: Past Surgical History:  Procedure Laterality Date  . BREAST CYST EXCISION    . BREAST EXCISIONAL BIOPSY Bilateral >10+ years ago   benign  . BREAST SURGERY Left    partail mastectomy-benign  . CATARACT  EXTRACTION, BILATERAL     [place]  . CHOLECYSTECTOMY N/A 11/29/2013   Procedure: LAPAROSCOPIC CHOLECYSTECTOMY;  Surgeon: Scherry Ran, MD;  Location: [facility];  Service: General;  Laterality: N/A;  . COLONOSCOPY N/A 03/14/2014   Procedure: COLONOSCOPY;  Surgeon: Rogene Houston, MD;  Location: [facility];  Service: Endoscopy;  Laterality: N/A;  1250  . COLONOSCOPY N/A 12/20/2019   Procedure: COLONOSCOPY;  Surgeon: Rogene Houston, MD;  Location: [facility];  Service: Endoscopy;  Laterality: N/A;  1020  . POLYPECTOMY  12/20/2019   Procedure: POLYPECTOMY;  Surgeon: Rogene Houston, MD;  Location: [facility];  Service: Endoscopy;;  . TONSILLECTOMY      FAMILY HISTORY: Family History  Problem Relation Age of Onset  . Cancer Mother        lung  . Cancer Father        lung  . Hypertension Father   . Cancer Sister        breast  . Heart disease Maternal Grandfather   . Heart attack Maternal Grandfather   . Heart disease Paternal Grandfather   . Heart attack Paternal Grandfather   . Cancer Other        breast   . Arthritis Sister        rheumatoid  . COPD Sister   . Breast cancer Sister 36  The patient's father died at age 20 from lung cancer in the setting o of tobacco abuse.  The patient's mother died at age 52 from the same.  The patient's mother had a niece with breast cancer in her 15s.  The patient herself had 2 sisters and 1 brother, and one of her sisters had breast cancer.  The patient herself has a niece (daughter from her sister who does not have breast cancer) with breast cancer diagnosed at age 82.  The patient also has 1 brother with no history of cancer   GYNECOLOGIC HISTORY:  No LMP recorded. Patient is postmenopausal. Menarche: 80 years old [place] P 0 LMP age 22 Contraceptive HRT no Hysterectomy? no BSO? 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  There are 2 (adopted) children are [name] lives near [place] and sells surgical equipment and [name] who is a Pharmacist, hospital in [place].  The patient has 5 grandchildren.  She is a Tourist information centre manager.    ADVANCED DIRECTIVES: In the absence of any documentation to the contrary, the patient's spouse is their HCPOA.    HEALTH MAINTENANCE: Social History   Tobacco Use  . Smoking status: Former Smoker    Packs/day: 0.50    Years: 4.00    Pack years: 2.00    Types: Cigarettes    Quit date: 11/29/1967    Years since quitting: 53.1  . Smokeless tobacco: Never Used  Substance Use Topics  . Alcohol use: Yes    Alcohol/week: 5.0 standard drinks    Types: 5 Glasses of wine per week    Comment: social  . Drug use: No     Colonoscopy: 12/2019 (Dr. Laural Golden), recall 2026  PAP: 02/2014, negative; repeat not indicated  Bone density: 2010?   No Known Allergies  Current Outpatient Medications  Medication Sig Dispense Refill  . Ascorbic Acid (VITAMIN C GUMMIES PO) Take 250 mg by mouth daily.    . busPIRone (BUSPAR) 7.5 MG tablet Take 1 tablet (7.5  mg total) by mouth 3 (three) times daily. (Patient taking differently: Take 7.5 mg by mouth 2 (two) times daily.) 90 tablet 3  . Cholecalciferol (VITAMIN D3 GUMMIES ADULT PO) Take 2,000 Units by mouth daily.    Marland Kitchen losartan (COZAAR) 25 MG tablet Take 25 mg by mouth daily.    . tamoxifen (NOLVADEX) 20 MG tablet Take 20 mg by mouth daily.    . hydrOXYzine (ATARAX/VISTARIL) 10 MG tablet Take 1 tablet (10 mg total) by mouth 3 (three) times daily as needed. For increased anxiety (Patient not taking: Reported on 01/19/2021) 30 tablet 0   No current facility-administered medications for this visit.    OBJECTIVE: White woman who appears younger than stated age  72:   01/19/21 1539  BP: (!) 146/89  Pulse: 86  Resp: 18  Temp: 98.6 F (37 C)  SpO2: 98%     Body mass index is 30.15 kg/m.   Wt Readings from Last 3 Encounters:  01/19/21 170 lb 3.2  oz (77.2 kg)  12/20/19 173 lb (78.5 kg)  07/04/18 178 lb (80.7 kg)      ECOG FS:1 - Symptomatic but completely ambulatory  Ocular: Sclerae unicteric, pupils round and equal Ear-nose-throat: Wearing a mask Lymphatic: No cervical or supraclavicular adenopathy Lungs no rales or rhonchi Heart regular rate and rhythm Abd soft, nontender, positive bowel sounds MSK no focal spinal tenderness, no joint edema Neuro: non-focal, well-oriented, appropriate affect Breasts: The right breast is unremarkable and I do not palpate any suspicious masses.  The left breast is status post remote biopsy for a benign finding.  Both axillae are benign   LAB RESULTS:  CMP     Component Value Date/Time   NA 145 01/19/2021 1514   K 3.7 01/19/2021 1514   CL 111 01/19/2021 1514   CO2 25 01/19/2021 1514   GLUCOSE 113 (H) 01/19/2021 1514   BUN 14 01/19/2021 1514   CREATININE 0.84 01/19/2021 1514   CREATININE 0.63 02/20/2014 1011   CALCIUM 9.5 01/19/2021 1514   PROT 7.0 01/19/2021 1514   ALBUMIN 4.2 01/19/2021 1514   AST 11 (L) 01/19/2021 1514   ALT 16 01/19/2021 1514   ALKPHOS 70 01/19/2021 1514   BILITOT 0.6 01/19/2021 1514   GFRNONAA >60 01/19/2021 1514   GFRAA >90 11/30/2013 0634    No results found for: TOTALPROTELP, ALBUMINELP, A1GS, A2GS, BETS, BETA2SER, GAMS, MSPIKE, SPEI  Lab Results  Component Value Date   WBC 8.0 01/19/2021   NEUTROABS 4.9 01/19/2021   HGB 14.3 01/19/2021   HCT 43.4 01/19/2021   MCV 92.9 01/19/2021   PLT 243 01/19/2021    No results found for: LABCA2  No components found for: FKCLEX517  No results for input(s): INR in the last 168 hours.  No results found for: LABCA2  No results found for: GYF749  No results found for: SWH675  No results found for: FFM384  No results found for: CA2729  No components found for: HGQUANT  No results found for: CEA1 / No results found for: CEA1   No results found for: AFPTUMOR  No results found for: CHROMOGRNA  No  results found for: KPAFRELGTCHN, LAMBDASER, KAPLAMBRATIO (kappa/lambda light chains)  No results found for: HGBA, HGBA2QUANT, HGBFQUANT, HGBSQUAN (Hemoglobinopathy evaluation)   No results found for: LDH  No results found for: IRON, TIBC, IRONPCTSAT (Iron and TIBC)  No results found for: FERRITIN  Urinalysis No results found for: COLORURINE, APPEARANCEUR, LABSPEC, PHURINE, GLUCOSEU, HGBUR, BILIRUBINUR, KETONESUR, [place], [place], NITRITE, LEUKOCYTESUR  STUDIES: US BREAST LTD UNI RIGHT INC AXILLA  Result Date: 01/01/2021 CLINICAL DATA:  Follow-up probably benign right breast mass. EXAM: ULTRASOUND OF THE RIGHT BREAST COMPARISON:  05/15/2020. FINDINGS: Targeted ultrasound is performed, showing an oval, hypoechoic, solid mass with interval macro lobulations in the 9 o'clock position of the right breast, 4 cm. The margins are currently partially circumscribed and partially indistinct. There is internal blood flow with power Doppler. This measures 1.3 x 0.6 x 0.6 cm, previously 1.1 x 0.5 x 0.5 cm. Ultrasound of the right axilla demonstrated normal appearing right axillary lymph nodes. IMPRESSION: Interval mild increase in size and irregularity of the previously demonstrated mass in the 9 o'clock position of the right breast some interval indistinct margins. The current findings are suspicious for possibility of malignancy. RECOMMENDATION: Ultrasound-guided core needle biopsy of the 1.3 cm mass in the o'clock position of the right breast. This has been discussed with the patient and scheduled for 2:45 p.m. today. I have discussed the findings and recommendations with the patient. If applicable, a reminder letter will be sent to the patient regarding the next appointment. BI-RADS CATEGORY  4: Suspicious. Electronically Signed   By: Claudie Revering M.D.   On: 01/01/2021 08:08   MM DIAG BREAST TOMO BILATERAL  Result Date: 01/07/2021 CLINICAL DATA:  Recent diagnosis of right breast cancer.  Patient is here for for yearly mammogram. EXAM: DIGITAL DIAGNOSTIC BILATERAL MAMMOGRAM WITH TOMOSYNTHESIS AND CAD TECHNIQUE: Bilateral digital diagnostic mammography and breast tomosynthesis was performed. The images were evaluated with computer-aided detection. COMPARISON:  Prior films ACR Breast Density Category b: There are scattered areas of fibroglandular density. FINDINGS: Cc and MLO views of bilateral breasts are submitted. Biopsy clip with mass identified in the right breast. The left breast is stable. IMPRESSION: Known right breast cancer. RECOMMENDATION: Treatment plan. I have discussed the findings and recommendations with the patient. If applicable, a reminder letter will be sent to the patient regarding the next appointment. BI-RADS CATEGORY  6: Known biopsy-proven malignancy. Electronically Signed   By: Abelardo Diesel M.D.   On: 01/07/2021 13:48   MM CLIP PLACEMENT RIGHT  Result Date: 01/01/2021 CLINICAL DATA:  Status post ultrasound-guided core biopsy of mass in the RIGHT breast. EXAM: DIAGNOSTIC RIGHT MAMMOGRAM POST ULTRASOUND BIOPSY COMPARISON:  Previous exam(s). FINDINGS: Mammographic images were obtained following ultrasound guided biopsy of mass in the 9 o'clock location of the RIGHT breast and placement of a ribbon shaped clip. The biopsy marking clip is in expected position at the site of biopsy. IMPRESSION: Appropriate positioning of the ribbon shaped biopsy marking clip at the site of biopsy in the 9 o'clock location of the RIGHT breast. Final Assessment: Post Procedure Mammograms for Marker Placement Electronically Signed   By: Nolon Nations M.D.   On: 01/01/2021 15:18   [place] RT BREAST BX W LOC DEV 1ST LESION IMG BX SPEC US GUIDE  Addendum Date: 01/04/2021   ADDENDUM REPORT: 01/04/2021 14:55 ADDENDUM: Pathology revealed DUCTAL CARCINOMA of the RIGHT breast, 9 o'clock, ribbon clip.The differential diagnosis includes encapsulated papillary carcinoma. This was found to be concordant by  Dr. Nolon Nations. Pathology results were discussed with the patient by telephone. The patient reported doing well after the biopsy with tenderness at the site. Post biopsy instructions and care were reviewed and questions were answered. The patient was encouraged to call [facility] for any additional concerns. The patient was instructed to return for bilateral diagnostic mammography on January 07, 2021. Surgical consultation  has been arranged with Dr. Autumn Messing at [facility] on January 12, 2021. Pathology results reported by Stacie Acres RN on 01/04/2021. Electronically Signed   By: Nolon Nations M.D.   On: 01/04/2021 14:55   Result Date: 01/04/2021 CLINICAL DATA:  Patient presents for ultrasound-guided core biopsy of RIGHT breast mass. EXAM: ULTRASOUND GUIDED RIGHT BREAST CORE NEEDLE BIOPSY COMPARISON:  Previous exam(s). PROCEDURE: I met with the patient and we discussed the procedure of ultrasound-guided biopsy, including benefits and alternatives. We discussed the high likelihood of a successful procedure. We discussed the risks of the procedure, including infection, bleeding, tissue injury, clip migration, and inadequate sampling. Informed written consent was given. The usual time-out protocol was performed immediately prior to the procedure. Lesion quadrant: RIGHT breast 9 o'clock location Using sterile technique and 1% Lidocaine as local anesthetic, under direct ultrasound visualization, a 12 gauge coaxial spring-loaded device was used to perform biopsy of mass in the 9 o'clock location of the RIGHT breast using a inferior to superior approach. At the conclusion of the procedure ribbon shaped tissue marker clip was deployed into the biopsy cavity. Follow up 2 view mammogram was performed and dictated separately. IMPRESSION: Ultrasound guided biopsy of RIGHT breast mass. No apparent complications. Electronically Signed: By: Nolon Nations M.D. On: 01/01/2021  15:17     ELIGIBLE FOR AVAILABLE RESEARCH PROTOCOL: no  ASSESSMENT: 80 y.o. 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 Specifically we discussed the biology of her breast cancer, its diagnosis, staging, treatment  options and prognosis.  I should add that there is some ambiguity in the pathology report regarding whether we are dealing with an invasive tumor or not.  I decided to discuss it as an invasive tumor with the patient.  If it turns out to be noninvasive then the plan proposed below still stands.  We first reviewed the fact that cancer is not one disease but more than 100 different diseases and that it is important to keep them separate-- otherwise when friends and relatives discuss their own cancer experiences with [name] confusion can result. Similarly we explained that if breast cancer spreads to the bone or liver, the patient would not have bone cancer or liver cancer, but breast cancer in the bone and breast cancer in the liver: one cancer in three places-- not 3 different cancers which otherwise would have to be treated in 3 different ways.  We discussed the difference between local and systemic therapy. In terms of loco-regional treatment, lumpectomy plus radiation is equivalent to mastectomy as far as survival is concerned. For this reason, and because the cosmetic results are generally superior, we recommend breast conserving surgery.   We then discussed the rationale for systemic therapy. There is some risk that this cancer may have already spread to other parts of her body. Patients frequently ask  at this point about bone scans, CAT scans and PET scans to find out if they have occult breast cancer somewhere else. The problem is that in early stage disease we are much more likely to find false positives then true cancers and this would expose the patient to unnecessary procedures  as well as unnecessary radiation. Scans cannot answer the question the patient really would like to know, which is whether she has microscopic disease elsewhere in her body. For those reasons we do not recommend them.  Of course we would proceed to aggressive evaluation of any symptoms that might suggest metastatic disease, but that is not the case here.  Next we went over the options for systemic therapy which are anti-estrogens, anti-HER-2 immunotherapy, and chemotherapy. [place] does not meet criteria for anti-HER-2 immunotherapy. She is a good candidate for anti-estrogens.  The question of chemotherapy is more complicated. Chemotherapy is most effective in rapidly growing, aggressive tumors. It is much less effective in slow growing cancers, like [name] 's.  Given that as well as the tumors general phenotype and the patient's age I do not anticipate chemotherapy to be needed and I am not requesting an Oncotype.  The plan then is for surgery, adjuvant radiation, and antiestrogens.  In addition [name] qualifies for genetics testing. In patients who carry a deleterious mutation [for example in a  BRCA gene], the risk of a new breast cancer developing in the future may be sufficiently great that the patient may choose bilateral mastectomies. However if she wishes to keep her breasts in that situation it is safe to do so. That would require intensified screening, which generally means not only yearly mammography but a yearly breast MRI as well.  [name] tells me intensified screening would clearly be her choice if she did turn out to have a deleterious mutation.  Accordingly there is no need to wait on genetics results before  proceeding to surgery  [name] has a good understanding of the overall plan. She agrees with it. She knows the goal of treatment in her case is cure. She will call with any problems that may develop before her next visit here.  Total encounter time 65 minutes.Sarajane Jews C. Purl Claytor, MD 01/19/2021 5:01 PM Medical Oncology and Hematology [facility] [place], [place] [address] Tel. [phone]    Fax. [phone]   This document serves as a record of services personally performed by Lurline Del, MD. It was created on his behalf by Wilburn Mylar, a trained medical scribe. The creation of this record is based on the scribe's personal observations and the provider's statements to them.   I, Lurline Del MD, have reviewed the above documentation for accuracy and completeness, and I agree with the above.    *Total Encounter Time as defined by the Centers for Medicare and Medicaid Services includes, in addition to the face-to-face time of a patient visit (documented in the note above) non-face-to-face time: obtaining and reviewing outside history, ordering and reviewing medications, tests or procedures, care coordination (communications with other health care professionals or caregivers) and documentation in the medical record.

## 2021-01-19 ENCOUNTER — Inpatient Hospital Stay: Payer: Medicare Other | Attending: Oncology | Admitting: Oncology

## 2021-01-19 ENCOUNTER — Inpatient Hospital Stay: Payer: Medicare Other

## 2021-01-19 ENCOUNTER — Other Ambulatory Visit: Payer: Self-pay

## 2021-01-19 DIAGNOSIS — I10 Essential (primary) hypertension: Secondary | ICD-10-CM | POA: Diagnosis not present

## 2021-01-19 DIAGNOSIS — Z87891 Personal history of nicotine dependence: Secondary | ICD-10-CM | POA: Insufficient documentation

## 2021-01-19 DIAGNOSIS — Z8249 Family history of ischemic heart disease and other diseases of the circulatory system: Secondary | ICD-10-CM | POA: Insufficient documentation

## 2021-01-19 DIAGNOSIS — C50411 Malignant neoplasm of upper-outer quadrant of right female breast: Secondary | ICD-10-CM | POA: Diagnosis not present

## 2021-01-19 DIAGNOSIS — Z170 Estrogen receptor positive status [ER+]: Secondary | ICD-10-CM | POA: Diagnosis not present

## 2021-01-19 DIAGNOSIS — Z79899 Other long term (current) drug therapy: Secondary | ICD-10-CM | POA: Diagnosis not present

## 2021-01-19 DIAGNOSIS — Z8261 Family history of arthritis: Secondary | ICD-10-CM | POA: Insufficient documentation

## 2021-01-19 DIAGNOSIS — Z801 Family history of malignant neoplasm of trachea, bronchus and lung: Secondary | ICD-10-CM | POA: Insufficient documentation

## 2021-01-19 DIAGNOSIS — Z803 Family history of malignant neoplasm of breast: Secondary | ICD-10-CM | POA: Insufficient documentation

## 2021-01-19 DIAGNOSIS — N6320 Unspecified lump in the left breast, unspecified quadrant: Secondary | ICD-10-CM

## 2021-01-19 LAB — CBC WITH DIFFERENTIAL (CANCER CENTER ONLY)
Abs Immature Granulocytes: 0.02 10*3/uL (ref 0.00–0.07)
Basophils Absolute: 0.1 10*3/uL (ref 0.0–0.1)
Basophils Relative: 1 %
Eosinophils Absolute: 0.1 10*3/uL (ref 0.0–0.5)
Eosinophils Relative: 1 %
HCT: 43.4 % (ref 36.0–46.0)
Hemoglobin: 14.3 g/dL (ref 12.0–15.0)
Immature Granulocytes: 0 %
Lymphocytes Relative: 29 %
Lymphs Abs: 2.3 10*3/uL (ref 0.7–4.0)
MCH: 30.6 pg (ref 26.0–34.0)
MCHC: 32.9 g/dL (ref 30.0–36.0)
MCV: 92.9 fL (ref 80.0–100.0)
Monocytes Absolute: 0.6 10*3/uL (ref 0.1–1.0)
Monocytes Relative: 8 %
Neutro Abs: 4.9 10*3/uL (ref 1.7–7.7)
Neutrophils Relative %: 61 %
Platelet Count: 243 10*3/uL (ref 150–400)
RBC: 4.67 MIL/uL (ref 3.87–5.11)
RDW: 13 % (ref 11.5–15.5)
WBC Count: 8 10*3/uL (ref 4.0–10.5)
nRBC: 0 % (ref 0.0–0.2)

## 2021-01-19 LAB — CMP (CANCER CENTER ONLY)
ALT: 16 U/L (ref 0–44)
AST: 11 U/L — ABNORMAL LOW (ref 15–41)
Albumin: 4.2 g/dL (ref 3.5–5.0)
Alkaline Phosphatase: 70 U/L (ref 38–126)
Anion gap: 9 (ref 5–15)
BUN: 14 mg/dL (ref 8–23)
CO2: 25 mmol/L (ref 22–32)
Calcium: 9.5 mg/dL (ref 8.9–10.3)
Chloride: 111 mmol/L (ref 98–111)
Creatinine: 0.84 mg/dL (ref 0.44–1.00)
GFR, Estimated: >60 mL/min (ref >60)
Glucose, Bld: 113 mg/dL — ABNORMAL HIGH (ref 70–99)
Potassium: 3.7 mmol/L (ref 3.5–5.1)
Sodium: 145 mmol/L (ref 135–145)
Total Bilirubin: 0.6 mg/dL (ref 0.3–1.2)
Total Protein: 7 g/dL (ref 6.5–8.1)

## 2021-01-20 ENCOUNTER — Ambulatory Visit
Admission: RE | Admit: 2021-01-20 | Discharge: 2021-01-20 | Disposition: A | Discharge status: Home / Self Care | Payer: Medicare Other | Source: Ambulatory Visit | Attending: Radiation Oncology | Admitting: Radiation Oncology

## 2021-01-20 ENCOUNTER — Ambulatory Visit
Admission: RE | Admit: 2021-01-20 | Discharge: 2021-01-20 | Disposition: A | Discharge status: Home / Self Care | Payer: Medicare Other | Source: Ambulatory Visit | Attending: Oncology | Admitting: Oncology

## 2021-01-20 ENCOUNTER — Encounter: Payer: Self-pay | Admitting: Licensed Clinical Social Worker

## 2021-01-20 ENCOUNTER — Telehealth: Payer: Self-pay | Admitting: Oncology

## 2021-01-20 ENCOUNTER — Encounter: Payer: Self-pay | Admitting: Radiation Oncology

## 2021-01-20 VITALS — Ht 63.0 in | Wt 170.0 lb

## 2021-01-20 DIAGNOSIS — Z170 Estrogen receptor positive status [ER+]: Secondary | ICD-10-CM

## 2021-01-20 DIAGNOSIS — C50411 Malignant neoplasm of upper-outer quadrant of right female breast: Secondary | ICD-10-CM | POA: Diagnosis not present

## 2021-01-20 NOTE — Progress Notes (Signed)
Radiation Oncology         ([phone]) [phone] ________________________________  Initial Outpatient Consultation - Conducted via telephone due to current COVID-19 concerns for limiting patient exposure  I spoke with the patient to conduct this consult visit via telephone to spare the patient unnecessary potential exposure in the healthcare setting during the current COVID-19 pandemic. The patient was notified in advance and was offered a [place] meeting to allow for face to face communication but unfortunately reported that they did not have the appropriate resources/technology to support such a visit and instead preferred to proceed with a telephone consult.   Name: [name]        MRN: [id number]  Date of Service: 01/20/2021 DOB: 24-Dec-1940  YQ:IHKV, Edwinna Areola, MD  Jovita Kussmaul, MD     REFERRING PHYSICIAN: Autumn Messing III, MD   DIAGNOSIS: The encounter diagnosis was Malignant neoplasm of upper-outer quadrant of right breast in female, estrogen receptor positive ([place]).   HISTORY OF PRESENT ILLNESS: [name] is a 80 y.o. female seen in the multidisciplinary breast clinic for a new diagnosis of right breast cancer. The patient was noted to have a screening detected abnormality in the right breast that was  and was set up for a 6 month recall diagnostic mammogram that showed a 1.3 cm mass without adenopathy.  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 No grade was assigned. The tumor was ER/PR positive HER2 negative with a Ki 67 of 15%. She's contacted by phone today to discuss treatment recommendations of her cancer. She's planning to proceed with lumpectomy and sentinel node biopsy on 01/26/21.     PREVIOUS RADIATION THERAPY: No   PAST MEDICAL HISTORY:  Past Medical History:  Diagnosis Date   Breast cancer ([place]) 01/05/2021   12/2020 Ductal carcinoma right breast    Breast mass, left 08/24/2015   Hypertension    Positive fecal occult  blood test 02/20/2014   Will send 3 cards home       PAST SURGICAL HISTORY: Past Surgical History:  Procedure Laterality Date   BREAST CYST EXCISION     BREAST EXCISIONAL BIOPSY Bilateral >10+ years ago   benign   BREAST SURGERY Left    partail mastectomy-benign   CATARACT EXTRACTION, BILATERAL     [place]   CHOLECYSTECTOMY N/A 11/29/2013   Procedure: LAPAROSCOPIC CHOLECYSTECTOMY;  Surgeon: Scherry Ran, MD;  Location: [facility];  Service: General;  Laterality: N/A;   COLONOSCOPY N/A 03/14/2014   Procedure: COLONOSCOPY;  Surgeon: Rogene Houston, MD;  Location: [facility];  Service: Endoscopy;  Laterality: N/A;  1250   COLONOSCOPY N/A 12/20/2019   Procedure: COLONOSCOPY;  Surgeon: Rogene Houston, MD;  Location: [facility];  Service: Endoscopy;  Laterality: N/A;  1020   POLYPECTOMY  12/20/2019   Procedure: POLYPECTOMY;  Surgeon: Rogene Houston, MD;  Location: [facility];  Service: Endoscopy;;   TONSILLECTOMY       FAMILY HISTORY:  Family History  Problem Relation Age of Onset   Cancer Mother        lung   Cancer Father        lung   Hypertension Father    Cancer Sister        breast   Heart disease Maternal Grandfather    Heart attack Maternal Grandfather    Heart disease Paternal Grandfather    Heart attack Paternal Grandfather    Cancer Other  breast    Arthritis Sister        rheumatoid   COPD Sister    Breast cancer Sister 70     SOCIAL HISTORY:  reports that she quit smoking about 53 years ago. Her smoking use included cigarettes. She has a 2.00 pack-year smoking history. She has never used smokeless tobacco. She reports current alcohol use of about 5.0 standard drinks of alcohol per week. She reports that she does not use drugs. She is married and lives in [place] and is very active. 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   ALLERGIES: Patient has no known allergies.   MEDICATIONS:  Current  Outpatient Medications  Medication Sig Dispense Refill   Ascorbic Acid (VITAMIN C GUMMIES PO) Take 250 mg by mouth daily.     busPIRone (BUSPAR) 7.5 MG tablet Take 1 tablet (7.5 mg total) by mouth 3 (three) times daily. (Patient taking differently: Take 7.5 mg by mouth 2 (two) times daily.) 90 tablet 3   Cholecalciferol (VITAMIN D3 GUMMIES ADULT PO) Take 2,000 Units by mouth daily.     hydrOXYzine (ATARAX/VISTARIL) 10 MG tablet Take 1 tablet (10 mg total) by mouth 3 (three) times daily as needed. For increased anxiety (Patient not taking: Reported on 01/19/2021) 30 tablet 0   losartan (COZAAR) 25 MG tablet Take 25 mg by mouth daily.     tamoxifen (NOLVADEX) 20 MG tablet Take 20 mg by mouth daily.     No current facility-administered medications for this encounter.     REVIEW OF SYSTEMS: On review of systems, the patient reports that she is doing well overall. She denies any concerns with breast pain since her biopsy. No other complaints are verbalized, but she is wanting to move up her postop appointment closer to her surgical date, and discuss with Dr. Jana Hakim if she could start her antiestrogen therapy sooner than after radiotherapy.     PHYSICAL EXAM:  Wt Readings from Last 3 Encounters:  01/19/21 170 lb 3.2 oz (77.2 kg)  12/20/19 173 lb (78.5 kg)  07/04/18 178 lb (80.7 kg)   Unable to assess due to encounter type.   ECOG = 0  0 - Asymptomatic (Fully active, able to carry on all predisease activities without restriction)  1 - Symptomatic but completely ambulatory (Restricted in physically strenuous activity but ambulatory and able to carry out work of a light or sedentary nature. For example, light housework, office work)  2 - Symptomatic, <50% in bed during the day (Ambulatory and capable of all self care but unable to carry out any work activities. Up and about more than 50% of waking hours)  3 - Symptomatic, >50% in bed, but not bedbound (Capable of only limited  self-care, confined to bed or chair 50% or more of waking hours)  4 - Bedbound (Completely disabled. Cannot carry on any self-care. Totally confined to bed or chair)  5 - Death   Eustace Pen MM, Creech RH, Tormey DC, et al. [phone]). "Toxicity and response criteria of the [facility]". [place] Oncol. 5 (6): 649-55    LABORATORY DATA:  Lab Results  Component Value Date   WBC 8.0 01/19/2021   HGB 14.3 01/19/2021   HCT 43.4 01/19/2021   MCV 92.9 01/19/2021   PLT 243 01/19/2021   Lab Results  Component Value Date   NA 145 01/19/2021   K 3.7 01/19/2021   CL 111 01/19/2021   CO2 25 01/19/2021   Lab  Results  Component Value Date   ALT 16 01/19/2021   AST 11 (L) 01/19/2021   ALKPHOS 70 01/19/2021   BILITOT 0.6 01/19/2021      RADIOGRAPHY: US BREAST LTD UNI RIGHT INC AXILLA  Result Date: 01/01/2021 CLINICAL DATA:  Follow-up probably benign right breast mass. EXAM: ULTRASOUND OF THE RIGHT BREAST COMPARISON:  05/15/2020. FINDINGS: Targeted ultrasound is performed, showing an oval, hypoechoic, solid mass with interval macro lobulations in the 9 o'clock position of the right breast, 4 cm. The margins are currently partially circumscribed and partially indistinct. There is internal blood flow with power Doppler. This measures 1.3 x 0.6 x 0.6 cm, previously 1.1 x 0.5 x 0.5 cm. Ultrasound of the right axilla demonstrated normal appearing right axillary lymph nodes. IMPRESSION: Interval mild increase in size and irregularity of the previously demonstrated mass in the 9 o'clock position of the right breast some interval indistinct margins. The current findings are suspicious for possibility of malignancy. RECOMMENDATION: Ultrasound-guided core needle biopsy of the 1.3 cm mass in the o'clock position of the right breast. This has been discussed with the patient and scheduled for 2:45 p.m. today. I have discussed the findings and recommendations with the patient. If applicable, a  reminder letter will be sent to the patient regarding the next appointment. BI-RADS CATEGORY  4: Suspicious. Electronically Signed   By: Claudie Revering M.D.   On: 01/01/2021 08:08   MM DIAG BREAST TOMO BILATERAL  Result Date: 01/07/2021 CLINICAL DATA:  Recent diagnosis of right breast cancer. Patient is here for for yearly mammogram. EXAM: DIGITAL DIAGNOSTIC BILATERAL MAMMOGRAM WITH TOMOSYNTHESIS AND CAD TECHNIQUE: Bilateral digital diagnostic mammography and breast tomosynthesis was performed. The images were evaluated with computer-aided detection. COMPARISON:  Prior films ACR Breast Density Category b: There are scattered areas of fibroglandular density. FINDINGS: Cc and MLO views of bilateral breasts are submitted. Biopsy clip with mass identified in the right breast. The left breast is stable. IMPRESSION: Known right breast cancer. RECOMMENDATION: Treatment plan. I have discussed the findings and recommendations with the patient. If applicable, a reminder letter will be sent to the patient regarding the next appointment. BI-RADS CATEGORY  6: Known biopsy-proven malignancy. Electronically Signed   By: Abelardo Diesel M.D.   On: 01/07/2021 13:48   MM CLIP PLACEMENT RIGHT  Result Date: 01/01/2021 CLINICAL DATA:  Status post ultrasound-guided core biopsy of mass in the RIGHT breast. EXAM: DIAGNOSTIC RIGHT MAMMOGRAM POST ULTRASOUND BIOPSY COMPARISON:  Previous exam(s). FINDINGS: Mammographic images were obtained following ultrasound guided biopsy of mass in the 9 o'clock location of the RIGHT breast and placement of a ribbon shaped clip. The biopsy marking clip is in expected position at the site of biopsy. IMPRESSION: Appropriate positioning of the ribbon shaped biopsy marking clip at the site of biopsy in the 9 o'clock location of the RIGHT breast. Final Assessment: Post Procedure Mammograms for Marker Placement Electronically Signed   By: Nolon Nations M.D.   On: 01/01/2021 15:18   [place] RT BREAST BX W LOC  DEV 1ST LESION IMG BX SPEC US GUIDE  Addendum Date: 01/04/2021   ADDENDUM REPORT: 01/04/2021 14:55 ADDENDUM: Pathology revealed DUCTAL CARCINOMA of the RIGHT breast, 9 o'clock, ribbon clip.The differential diagnosis includes encapsulated papillary carcinoma. This was found to be concordant by Dr. Nolon Nations. Pathology results were discussed with the patient by telephone. The patient reported doing well after the biopsy with tenderness at the site. Post biopsy instructions and care were reviewed and questions were answered. The  patient was encouraged to call [facility] for any additional concerns. The patient was instructed to return for bilateral diagnostic mammography on January 07, 2021. Surgical consultation has been arranged with Dr. Autumn Messing at [facility] on January 12, 2021. Pathology results reported by Stacie Acres RN on 01/04/2021. Electronically Signed   By: Nolon Nations M.D.   On: 01/04/2021 14:55   Result Date: 01/04/2021 CLINICAL DATA:  Patient presents for ultrasound-guided core biopsy of RIGHT breast mass. EXAM: ULTRASOUND GUIDED RIGHT BREAST CORE NEEDLE BIOPSY COMPARISON:  Previous exam(s). PROCEDURE: I met with the patient and we discussed the procedure of ultrasound-guided biopsy, including benefits and alternatives. We discussed the high likelihood of a successful procedure. We discussed the risks of the procedure, including infection, bleeding, tissue injury, clip migration, and inadequate sampling. Informed written consent was given. The usual time-out protocol was performed immediately prior to the procedure. Lesion quadrant: RIGHT breast 9 o'clock location Using sterile technique and 1% Lidocaine as local anesthetic, under direct ultrasound visualization, a 12 gauge coaxial spring-loaded device was used to perform biopsy of mass in the 9 o'clock location of the RIGHT breast using a inferior to superior approach. At the conclusion of the  procedure ribbon shaped tissue marker clip was deployed into the biopsy cavity. Follow up 2 view mammogram was performed and dictated separately. IMPRESSION: Ultrasound guided biopsy of RIGHT breast mass. No apparent complications. Electronically Signed: By: Nolon Nations M.D. On: 01/01/2021 15:17       IMPRESSION/PLAN: 1. 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 Dr. Lisbeth Renshaw discusses the pathology findings and reviews the nature of right breast disease. The consensus from the breast conference includes breast conservation with lumpectomy with sentinel node biopsy.Dr. Jana Hakim does not anticipate a role for chemotherapy. She was counseled on the role of external radiotherapy to the breast  to reduce risks of local recurrence followed by antiestrogen therapy. She is also aware of some scenarios based on final pathology results to determine if radiotherapy may be optional rather than required, but she is still aggressively minded and desires standard treatment with adjuvant radiotherapy. We discussed the risks, benefits, short, and long term effects of radiotherapy, as well as the curative intent, and the patient is interested in proceeding. Dr. Lisbeth Renshaw discusses the delivery and logistics of radiotherapy and anticipates a course of 4 weeks of radiotherapy. We will see her back a few weeks after surgery to discuss the simulation process and anticipate we starting radiotherapy about 4-6 weeks after surgery. She will also follow up with Dr. Ethlyn Gallery office about appointments, and with Dr. Jana Hakim to ask about starting antiestrogen therapy now. We will follow this expectantly.     Given current concerns for patient exposure during the COVID-19 pandemic, this encounter was conducted via telephone.  The patient has provided two factor identification and has given verbal consent for this type of encounter and has been advised to only accept a meeting of this type in a secure  network environment. The time spent during this encounter was 60 minutes including preparation, discussion, and coordination of the patient's care. The attendants for this meeting include Blenda Nicely, RN, Dr. Lisbeth Renshaw, Hayden Pedro  and Gertie Baron.  During the encounter,  Blenda Nicely, RN, Dr. Lisbeth Renshaw, and Hayden Pedro were located at [facility] Radiation Oncology Department.  Bettyjean L Scrivner was located at home.    The above documentation reflects my direct  findings during this shared patient visit. Please see the separate note by Dr. Lisbeth Renshaw on this date for the remainder of the patient's plan of care.    Carola Rhine, [facility]    **Disclaimer: This note was dictated with voice recognition software. Similar sounding words can inadvertently be transcribed and this note may contain transcription errors which may not have been corrected upon publication of note.**

## 2021-01-20 NOTE — Progress Notes (Signed)
[facility] Psychosocial Distress Screening Clinical Social Work  Clinical Social Work was referred by distress screening protocol.  The patient scored a 10 on the Psychosocial Distress Thermometer which indicates severe distress. Clinical Social Worker contacted patient by phone to assess for distress and other psychosocial needs.   Pt reports feeling better after her appointments yesterday and today. She has strong support from her husband, daughter, other family, friends, and neighbors. She also has two friends who themselves have been through breast cancer and are strong support. No other resource (transportation, food, housing) needs at this time.  CSW and patient discussed the importance of support during treatment.  CSW informed patient of the support team and support services at [facility].  CSW provided contact information and encouraged patient to call with any questions or concerns.  ONCBCN DISTRESS SCREENING 01/20/2021  Screening Type Initial Screening  Distress experienced in past week (1-10) 10  Emotional problem type Nervousness/Anxiety;Adjusting to illness  Information Concerns Type Lack of info about treatment  Other Contact via phone    Clinical Social Worker follow up needed: No.  If yes, follow up plan:  Lyan Moyano E Mehlani Blankenburg, LCSW

## 2021-01-20 NOTE — Progress Notes (Signed)
New Breast Cancer Diagnosis: Right Breast- UOQ  Did patient present with symptoms (if so, please note symptoms) or screening mammography?:Screening Mass    Repeat right breast ultrasound was performed on 01/01/2021:  mild interval increase in size and irregularity of previously demonstrated mass in right breast at 9 o'clock, now 1.3 cm; normal-appearing right axillary lymph nodes.  Diagnostic Mammogram 05/15/2020: Breast density category B; probable benign 1.1 cm mass in right breast at 9 o'clock. A six-month follow up right breast ultrasound was recommended.  Mammogram 05/11/2020: Possible abnormality in the right breast.  Location and Extent of disease :right breast. Located at 9 o'clock position, measured  1.3 cm in greatest dimension. Adenopathy no.  Histology per Pathology Report: grade- not assigned , Ductal Carcinoma 01/01/2021  Receptor Status: ER(positive), PR (positive), Her2-neu (negative), Ki-(15%)  Surgeon and surgical plan, if any: Dr. Marlou Starks -Right Lumpectomy with radioactive seed and SLN biopsy 01/26/2021  Medical oncologist, treatment if any:  Dr. Jana Hakim 01/19/2021 -Her case was also presented at the multidisciplinary breast cancer conference on 01/13/2021. At that time a preliminary plan was proposed: Breast conserving surgery with or without sentinel lymph node sampling, antiestrogens, genetics testing -She is scheduled to meet with Dr. Lisbeth Renshaw tomorrow, 01/20/2021, to discuss radiation therapy.  -She is also scheduled for right lumpectomy on 01/26/2021 under Dr. Marlou Starks.  Family History of Breast/Ovarian/Prostate Cancer: younger sister had breast cancer about 15 years ago.  Niece had Breast cancer  Lymphedema issues, if any:  No  Pain issues, if any: No  SAFETY ISSUES: Prior radiation? No Pacemaker/ICD? No Possible current pregnancy? Postmenopausal Is the patient on methotrexate? No  Current Complaints / other details:

## 2021-01-20 NOTE — Telephone Encounter (Signed)
Scheduled appts per 3/8 los. Left voicemail with next appt date/time.

## 2021-01-21 ENCOUNTER — Encounter: Payer: Self-pay | Admitting: *Deleted

## 2021-01-21 ENCOUNTER — Telehealth: Payer: Self-pay | Admitting: *Deleted

## 2021-01-21 DIAGNOSIS — Z170 Estrogen receptor positive status [ER+]: Secondary | ICD-10-CM

## 2021-01-21 NOTE — Progress Notes (Signed)
Surgical Instructions   Your procedure is scheduled on Tuesday, March 15th .  Report to [facility] Main Entrance "A" at 5:30 A.M., then check in with the Admitting office.  Call this number if you have problems the morning of surgery:  [phone]   If you have any questions prior to your surgery date call [phone]: Open Monday-Friday 8am-4pm   Remember:  Do not eat after midnight the night before your surgery  You may drink clear liquids until 4:30 A.M. the morning of your surgery.   Clear liquids allowed are: Water, Non-Citrus Juices (without pulp), Carbonated Beverages, Clear Tea, Black Coffee Only, and Gatorade    Take these medicines the morning of surgery with A SIP OF WATER  busPIRone (BUSPAR)  As of today, STOP taking any Aspirin (unless otherwise instructed by your surgeon) Aleve, Naproxen, Ibuprofen, Motrin, Advil, Goody's, BC's, all herbal medications, fish oil, and all vitamins.                     Do not wear jewelry, make up, or nail polish            Do not wear lotions, powders, perfumes, or deodorant.            Do not shave 48 hours prior to surgery.              Do not bring valuables to the hospital.            [facility] is not responsible for any belongings or valuables.  Do NOT Smoke (Tobacco/Vaping) or drink Alcohol 24 hours prior to your procedure If you use a CPAP at night, you may bring all equipment for your overnight stay.   Contacts, glasses, dentures or bridgework may not be worn into surgery, please bring cases for these belongings   For patients admitted to the hospital, discharge time will be determined by your treatment team.   Patients discharged the day of surgery will not be allowed to drive home, and someone needs to stay with them for 24 hours.  Special instructions:   - Preparing For Surgery  Before surgery, you can play an important role. Because skin is not sterile, your skin needs to be as free of germs as possible.  You can reduce the number of germs on your skin by washing with CHG (chlorahexidine gluconate) Soap before surgery.  CHG is an antiseptic cleaner which kills germs and bonds with the skin to continue killing germs even after washing.    Oral Hygiene is also important to reduce your risk of infection.  Remember - BRUSH YOUR TEETH THE MORNING OF SURGERY WITH YOUR REGULAR TOOTHPASTE  Please do not use if you have an allergy to CHG or antibacterial soaps. If your skin becomes reddened/irritated stop using the CHG.  Do not shave (including legs and underarms) for at least 48 hours prior to first CHG shower. It is OK to shave your face.  Please follow these instructions carefully.   1. Shower the NIGHT BEFORE SURGERY and the MORNING OF SURGERY  2. If you chose to wash your hair, wash your hair first as usual with your normal shampoo. After you shampoo, rinse your hair and body thoroughly to remove the shampoo.  3. Wash Face and genitals (private parts) with your normal soap.   4. Use CHG Soap as you would any other liquid soap. You can apply CHG directly to the skin and wash gently with a scrungie or  a clean washcloth.   5. Apply the CHG Soap to your body ONLY FROM THE NECK DOWN.  Do not use on open wounds or open sores. Avoid contact with your eyes, ears, mouth and genitals (private parts). Wash Face and genitals (private parts)  with your normal soap.   6. Wash thoroughly, paying special attention to the area where your surgery will be performed.  7. Thoroughly rinse your body with warm water from the neck down.  8. DO NOT shower/wash with your normal soap after using and rinsing off the CHG Soap.  9. Pat yourself dry with a CLEAN TOWEL.  10. Wear CLEAN PAJAMAS to bed the night before surgery  11. Place CLEAN SHEETS on your bed the night before your surgery  12. DO NOT SLEEP WITH PETS.  Day of Surgery: Shower with CHG soap  Wear Clean/Comfortable clothing the morning of surgery Do not  apply any deodorants/lotions.   Remember to brush your teeth WITH YOUR REGULAR TOOTHPASTE.   Please read over the following fact sheets that you were given.

## 2021-01-21 NOTE — Telephone Encounter (Signed)
Spoke to pt regarding navigation resources and contact information. Denies questions or concerns regarding dx or treatment care plan. Encourage pt to call with needs. Received verbal understanding. 

## 2021-01-22 ENCOUNTER — Encounter (HOSPITAL_COMMUNITY): Payer: Self-pay

## 2021-01-22 ENCOUNTER — Inpatient Hospital Stay: Admission: RE | Admit: 2021-01-22 | Payer: Medicare Other | Source: Ambulatory Visit

## 2021-01-22 ENCOUNTER — Encounter (HOSPITAL_COMMUNITY)
Admission: RE | Admit: 2021-01-22 | Discharge: 2021-01-22 | Disposition: A | Discharge status: Home / Self Care | Payer: Medicare Other | Source: Ambulatory Visit | Attending: General Surgery | Admitting: General Surgery

## 2021-01-22 ENCOUNTER — Other Ambulatory Visit (HOSPITAL_COMMUNITY): Payer: Medicare Other

## 2021-01-22 ENCOUNTER — Other Ambulatory Visit: Payer: Self-pay

## 2021-01-22 DIAGNOSIS — Z01812 Encounter for preprocedural laboratory examination: Secondary | ICD-10-CM | POA: Insufficient documentation

## 2021-01-22 HISTORY — DX: Anxiety disorder, unspecified: F41.9

## 2021-01-22 NOTE — Progress Notes (Signed)
PCP - Dr. Merlyn Albert  Cardiologist - Denies  Chest x-ray - Denies  EKG - DOS- 01/29/21  Stress Test - Denies  ECHO - Denies  Cardiac Cath - Denies  AICD-na PM-na LOOP-na  Sleep Study - Denies CPAP - Denies  LABS- 01/19/21 (E): CBC w/D, CMP 01/26/21: COVID  ASA- Denies  ERAS- Yes- No drink  HA1C-Denies  Anesthesia- No  Pt denies having chest pain, sob, or fever at this time. All instructions explained to the pt, with a verbal understanding of the material. Pt agrees to go over the instructions while at home for a better understanding. Pt also instructed to self quarantine after being tested for COVID-19. The opportunity to ask questions was provided.   Coronavirus Screening  Have you experienced the following symptoms:  Cough yes/no: No Fever (>100.19F)  yes/no: No Runny nose yes/no: No Sore throat yes/no: No Difficulty breathing/shortness of breath  yes/no: No  Have you or a family member traveled in the last 14 days and where? yes/no: No   If the patient indicates "YES" to the above questions, their PAT will be rescheduled to limit the exposure to others and, the surgeon will be notified. THE PATIENT WILL NEED TO BE ASYMPTOMATIC FOR 14 DAYS.   If the patient is not experiencing any of these symptoms, the PAT nurse will instruct them to NOT bring anyone with them to their appointment since they may have these symptoms or traveled as well.   Please remind your patients and families that hospital visitation restrictions are in effect and the importance of the restrictions.

## 2021-01-22 NOTE — Progress Notes (Signed)
Patient scheduled for seed placement and COVID19 testing on the same day 01/22/2021. Spoke with Elmo Putt from [facility] to relay information. Was told that they will talk with the surgeon's assistant to have appointments changed because COVID19 test has to be completed prior to seed placement. PAT call back number provided.

## 2021-01-22 NOTE — Progress Notes (Addendum)
Spoke with pt and she agreed to come back on Tues 3/15 for an EKG following her covid test. PA Ebony Hail made aware.

## 2021-01-22 NOTE — Progress Notes (Signed)
Your procedure is scheduled on Fri., January 29, 2021 from 8:50AM- 10:09AM             Report to [facility] Main Entrance "A" at 6:50 A.M., then check in with the Admitting office.             Call this number if you have problems the morning of surgery:             [phone]   If you have any questions prior to your surgery date call [phone]: Open Monday-Friday 8am-4pm              Remember:             Do not eat after midnight the night before your surgery  You may drink clear liquids until 4:30 A.M. the morning of your surgery.   Clear liquids allowed are: Water, Non-Citrus Juices (without pulp), Carbonated Beverages, Clear Tea, Black Coffee Only, and Gatorade                          Take these medicines the morning of surgery with A SIP OF WATER  busPIRone (BUSPAR)  As of today, STOP taking any Aspirin (unless otherwise instructed by your surgeon) Aleve, Naproxen, Ibuprofen, Motrin, Advil, Goody's, BC's, all herbal medications, fish oil, and all vitamins.  Do not wear jewelry, make up, or nail polish Do notwear lotions, powders, perfumes, or deodorant. Do notshave 48 hours prior to surgery.  Do notbring valuables to the hospital. [facility] is not responsible for any belongings or valuables.  Do NOT Smoke (Tobacco/Vaping) or drink Alcohol 24 hours prior to your procedure If you use a CPAP at night, you may bring all equipment for your overnight stay.  Contacts, glasses, dentures or bridgework may not be worn into surgery, please bring cases for these belongings  For patients admitted to the hospital, discharge time will be determined by your treatment team.  Patients discharged the day of surgery will not be allowed to drive home, and someone needs to stay with them for 24 hours.  Special instructions:   [facility]- Preparing For Surgery  Before surgery, you can play an important role.  Because skin is not sterile, your skin needs to be as free of germs as possible. You can reduce the number of germs on your skin by washing with CHG (chlorahexidine gluconate) Soap before surgery.  CHG is an antiseptic cleaner which kills germs and bonds with the skin to continue killing germs even after washing.    Oral Hygiene is also important to reduce your risk of infection.  Remember - BRUSH YOUR TEETH THE MORNING OF SURGERY WITH YOUR REGULAR TOOTHPASTE  Please do not use if you have an allergy to CHG or antibacterial soaps. If your skin becomes reddened/irritated stop using the CHG.  Do not shave (including legs and underarms) for at least 48 hours prior to first CHG shower. It is OK to shave your face.  Please follow these instructions carefully.  1. Shower the NIGHT BEFORE SURGERY and the MORNING OF SURGERY  2. If you chose to wash your hair, wash your hair first as usual with your normal shampoo. After you shampoo, rinse your hair and body thoroughly to remove the shampoo.  3. Wash Face and genitals (private parts) with your normal soap.   4. Use CHG Soap as you would any other liquid soap. You can apply CHG directly to the skin and wash gently with a scrungie or a clean washcloth.   5. Apply the CHG Soap to your body ONLY FROM THE NECK DOWN.  Do not use on open wounds or open sores. Avoid contact with your eyes, ears, mouth and genitals (private parts). Wash Face and genitals (private parts)  with your normal soap.   6. Wash thoroughly, paying special attention to the area where your surgery will be performed.  7. Thoroughly rinse your body with warm water from the neck down.  8. DO NOT shower/wash with your normal soap after using and rinsing off the CHG Soap.  9. Pat yourself dry with a CLEAN TOWEL.  10. Wear CLEAN PAJAMAS to bed the night  before surgery  11. Place CLEAN SHEETS on your bed the night before your surgery  12. DO NOT SLEEP WITH PETS.  Day of Surgery: Shower with CHG soap  Wear Clean/Comfortable clothing the morning of surgery Do notapply any deodorants/lotions.  Remember to brush your teeth WITH YOUR REGULAR TOOTHPASTE.  Please read over the following fact sheets that you were given.

## 2021-01-26 ENCOUNTER — Ambulatory Visit (HOSPITAL_COMMUNITY): Payer: Medicare Other

## 2021-01-26 ENCOUNTER — Encounter (HOSPITAL_COMMUNITY)
Admission: RE | Admit: 2021-01-26 | Discharge: 2021-01-26 | Disposition: A | Discharge status: Home / Self Care | Payer: Medicare Other | Source: Ambulatory Visit | Attending: General Surgery | Admitting: General Surgery

## 2021-01-26 ENCOUNTER — Other Ambulatory Visit: Payer: Self-pay

## 2021-01-26 ENCOUNTER — Other Ambulatory Visit (HOSPITAL_COMMUNITY): Payer: Medicare Other

## 2021-01-26 ENCOUNTER — Other Ambulatory Visit (HOSPITAL_COMMUNITY)
Admission: RE | Admit: 2021-01-26 | Discharge: 2021-01-26 | Disposition: A | Discharge status: Home / Self Care | Payer: Medicare Other | Source: Ambulatory Visit | Attending: General Surgery | Admitting: General Surgery

## 2021-01-26 DIAGNOSIS — Z20822 Contact with and (suspected) exposure to covid-19: Secondary | ICD-10-CM | POA: Diagnosis not present

## 2021-01-26 DIAGNOSIS — Z01812 Encounter for preprocedural laboratory examination: Secondary | ICD-10-CM | POA: Insufficient documentation

## 2021-01-26 LAB — SARS CORONAVIRUS 2 (TAT 6-24 HRS): SARS Coronavirus 2: NEGATIVE

## 2021-01-28 ENCOUNTER — Other Ambulatory Visit: Payer: Self-pay

## 2021-01-28 ENCOUNTER — Ambulatory Visit
Admission: RE | Admit: 2021-01-28 | Discharge: 2021-01-28 | Disposition: A | Discharge status: Home / Self Care | Payer: Medicare Other | Source: Ambulatory Visit | Attending: General Surgery | Admitting: General Surgery

## 2021-01-28 DIAGNOSIS — C50911 Malignant neoplasm of unspecified site of right female breast: Secondary | ICD-10-CM | POA: Diagnosis not present

## 2021-01-28 DIAGNOSIS — C50411 Malignant neoplasm of upper-outer quadrant of right female breast: Secondary | ICD-10-CM

## 2021-01-28 DIAGNOSIS — Z170 Estrogen receptor positive status [ER+]: Secondary | ICD-10-CM

## 2021-01-28 NOTE — Anesthesia Preprocedure Evaluation (Addendum)
Anesthesia Evaluation  Patient identified by MRN, date of birth, ID band Patient awake    Reviewed: Allergy & Precautions, NPO status , Patient's Chart, lab work & pertinent test results  History of Anesthesia Complications (+) AWARENESS UNDER ANESTHESIA and history of anesthetic complications  Airway Mallampati: II  TM Distance: >3 FB Neck ROM: Full    Dental  (+) Dental Advisory Given, Teeth Intact   Pulmonary former smoker,    Pulmonary exam normal        Cardiovascular hypertension, Pt. on medications Normal cardiovascular exam     Neuro/Psych PSYCHIATRIC DISORDERS Anxiety negative neurological ROS     GI/Hepatic negative GI ROS, Neg liver ROS,   Endo/Other   Obesity   Renal/GU negative Renal ROS     Musculoskeletal negative musculoskeletal ROS (+)   Abdominal   Peds  Hematology negative hematology ROS (+)   Anesthesia Other Findings Covid test negative   Reproductive/Obstetrics  Breast cancer                             Anesthesia Physical Anesthesia Plan  ASA: II  Anesthesia Plan: General   Post-op Pain Management:  Regional for Post-op pain   Induction: Intravenous  PONV Risk Score and Plan: 3 and Treatment may vary due to age or medical condition, Ondansetron and Propofol infusion  Airway Management Planned: LMA  Additional Equipment: None  Intra-op Plan:   Post-operative Plan: Extubation in OR  Informed Consent: I have reviewed the patients History and Physical, chart, labs and discussed the procedure including the risks, benefits and alternatives for the proposed anesthesia with the patient or authorized representative who has indicated his/her understanding and acceptance.     Dental advisory given  Plan Discussed with: CRNA and Anesthesiologist  Anesthesia Plan Comments: (Will utilize BIS monitor given hx of awareness under anesthesia )        Anesthesia Quick Evaluation

## 2021-01-29 ENCOUNTER — Ambulatory Visit (HOSPITAL_COMMUNITY): Payer: Medicare Other | Admitting: Anesthesiology

## 2021-01-29 ENCOUNTER — Ambulatory Visit (HOSPITAL_COMMUNITY)
Admission: RE | Admit: 2021-01-29 | Discharge: 2021-01-29 | Disposition: A | Discharge status: Home / Self Care | Payer: Medicare Other | Attending: General Surgery | Admitting: General Surgery

## 2021-01-29 ENCOUNTER — Encounter (HOSPITAL_COMMUNITY)
Admission: RE | Disposition: A | Discharge status: Home / Self Care | Payer: Self-pay | Source: Home / Self Care | Attending: General Surgery

## 2021-01-29 ENCOUNTER — Ambulatory Visit (HOSPITAL_COMMUNITY)
Admission: RE | Admit: 2021-01-29 | Discharge: 2021-01-29 | Disposition: A | Discharge status: Home / Self Care | Payer: Medicare Other | Source: Ambulatory Visit | Attending: General Surgery | Admitting: General Surgery

## 2021-01-29 ENCOUNTER — Ambulatory Visit (HOSPITAL_COMMUNITY): Payer: Medicare Other | Admitting: Vascular Surgery

## 2021-01-29 ENCOUNTER — Encounter (HOSPITAL_COMMUNITY): Payer: Self-pay | Admitting: General Surgery

## 2021-01-29 ENCOUNTER — Ambulatory Visit
Admission: RE | Admit: 2021-01-29 | Discharge: 2021-01-29 | Disposition: A | Discharge status: Home / Self Care | Payer: Medicare Other | Source: Ambulatory Visit | Attending: General Surgery | Admitting: General Surgery

## 2021-01-29 DIAGNOSIS — Z87891 Personal history of nicotine dependence: Secondary | ICD-10-CM | POA: Diagnosis not present

## 2021-01-29 DIAGNOSIS — Z8249 Family history of ischemic heart disease and other diseases of the circulatory system: Secondary | ICD-10-CM | POA: Insufficient documentation

## 2021-01-29 DIAGNOSIS — C50411 Malignant neoplasm of upper-outer quadrant of right female breast: Secondary | ICD-10-CM | POA: Insufficient documentation

## 2021-01-29 DIAGNOSIS — Z170 Estrogen receptor positive status [ER+]: Secondary | ICD-10-CM

## 2021-01-29 DIAGNOSIS — Z803 Family history of malignant neoplasm of breast: Secondary | ICD-10-CM | POA: Insufficient documentation

## 2021-01-29 DIAGNOSIS — Z823 Family history of stroke: Secondary | ICD-10-CM | POA: Insufficient documentation

## 2021-01-29 DIAGNOSIS — N6011 Diffuse cystic mastopathy of right breast: Secondary | ICD-10-CM | POA: Diagnosis not present

## 2021-01-29 DIAGNOSIS — C50911 Malignant neoplasm of unspecified site of right female breast: Secondary | ICD-10-CM | POA: Diagnosis not present

## 2021-01-29 DIAGNOSIS — I10 Essential (primary) hypertension: Secondary | ICD-10-CM | POA: Diagnosis not present

## 2021-01-29 DIAGNOSIS — Z79899 Other long term (current) drug therapy: Secondary | ICD-10-CM | POA: Insufficient documentation

## 2021-01-29 DIAGNOSIS — G8918 Other acute postprocedural pain: Secondary | ICD-10-CM | POA: Diagnosis not present

## 2021-01-29 HISTORY — PX: BREAST LUMPECTOMY WITH RADIOACTIVE SEED AND SENTINEL LYMPH NODE BIOPSY: SHX6550

## 2021-01-29 SURGERY — BREAST LUMPECTOMY WITH RADIOACTIVE SEED AND SENTINEL LYMPH NODE BIOPSY
Anesthesia: General | Site: Breast | Laterality: Right

## 2021-01-29 MED ORDER — CEFAZOLIN SODIUM-DEXTROSE 2-4 GM/100ML-% IV SOLN
2.0000 g | INTRAVENOUS | Status: AC
  Administered 2021-01-29: 2 g via INTRAVENOUS
  Filled 2021-01-29: qty 100

## 2021-01-29 MED ORDER — CHLORHEXIDINE GLUCONATE 0.12 % MT SOLN
15.0000 mL | Freq: Once | OROMUCOSAL | Status: AC
  Administered 2021-01-29: 15 mL via OROMUCOSAL
  Filled 2021-01-29: qty 15

## 2021-01-29 MED ORDER — ACETAMINOPHEN 500 MG PO TABS
1000.0000 mg | ORAL_TABLET | ORAL | Status: AC
  Administered 2021-01-29: 1000 mg via ORAL
  Filled 2021-01-29: qty 2

## 2021-01-29 MED ORDER — ONDANSETRON HCL 4 MG/2ML IJ SOLN
INTRAMUSCULAR | Status: DC | PRN
  Administered 2021-01-29: 4 mg via INTRAVENOUS

## 2021-01-29 MED ORDER — FENTANYL CITRATE (PF) 250 MCG/5ML IJ SOLN
INTRAMUSCULAR | Status: DC | PRN
  Administered 2021-01-29 (×2): 50 ug via INTRAVENOUS

## 2021-01-29 MED ORDER — PROPOFOL 10 MG/ML IV BOLUS
INTRAVENOUS | Status: DC | PRN
  Administered 2021-01-29: 60 mg via INTRAVENOUS
  Administered 2021-01-29: 40 mg via INTRAVENOUS
  Administered 2021-01-29: 120 mg via INTRAVENOUS

## 2021-01-29 MED ORDER — HYDROCODONE-ACETAMINOPHEN 5-325 MG PO TABS: 1.0000 | ORAL_TABLET | Freq: Four times a day (QID) | ORAL | 0 refills | Status: DC | PRN

## 2021-01-29 MED ORDER — OXYCODONE HCL 5 MG PO TABS: 5.0000 mg | ORAL_TABLET | Freq: Once | ORAL | Status: DC | PRN

## 2021-01-29 MED ORDER — LACTATED RINGERS IV SOLN: INTRAVENOUS | Status: DC | PRN

## 2021-01-29 MED ORDER — BUPIVACAINE LIPOSOME 1.3 % IJ SUSP
INTRAMUSCULAR | Status: DC | PRN
  Administered 2021-01-29: 10 mL

## 2021-01-29 MED ORDER — LIDOCAINE HCL (CARDIAC) PF 100 MG/5ML IV SOSY
PREFILLED_SYRINGE | INTRAVENOUS | Status: DC | PRN
  Administered 2021-01-29: 50 mg via INTRAVENOUS

## 2021-01-29 MED ORDER — OXYCODONE HCL 5 MG/5ML PO SOLN
5.0000 mg | Freq: Once | ORAL | Status: DC | PRN
Start: 2021-01-29 — End: 2021-01-29

## 2021-01-29 MED ORDER — MIDAZOLAM HCL 5 MG/5ML IJ SOLN
INTRAMUSCULAR | Status: DC | PRN
  Administered 2021-01-29: 1 mg via INTRAVENOUS

## 2021-01-29 MED ORDER — FENTANYL CITRATE (PF) 100 MCG/2ML IJ SOLN
25.0000 ug | INTRAMUSCULAR | Status: DC | PRN
  Administered 2021-01-29: 25 ug via INTRAVENOUS

## 2021-01-29 MED ORDER — ONDANSETRON HCL 4 MG/2ML IJ SOLN: 4.0000 mg | Freq: Once | INTRAMUSCULAR | Status: DC | PRN

## 2021-01-29 MED ORDER — MIDAZOLAM HCL 2 MG/2ML IJ SOLN
INTRAMUSCULAR | Status: AC
  Filled 2021-01-29: qty 2

## 2021-01-29 MED ORDER — ORAL CARE MOUTH RINSE: 15.0000 mL | Freq: Once | OROMUCOSAL | Status: AC

## 2021-01-29 MED ORDER — PHENYLEPHRINE HCL (PRESSORS) 10 MG/ML IV SOLN
INTRAVENOUS | Status: DC | PRN
  Administered 2021-01-29 (×2): 120 ug via INTRAVENOUS
  Administered 2021-01-29 (×3): 80 ug via INTRAVENOUS

## 2021-01-29 MED ORDER — CELECOXIB 200 MG PO CAPS
200.0000 mg | ORAL_CAPSULE | ORAL | Status: AC
  Administered 2021-01-29: 200 mg via ORAL
  Filled 2021-01-29: qty 1

## 2021-01-29 MED ORDER — BUPIVACAINE HCL (PF) 0.25 % IJ SOLN
INTRAMUSCULAR | Status: DC | PRN
  Administered 2021-01-29: 22 mL

## 2021-01-29 MED ORDER — CHLORHEXIDINE GLUCONATE CLOTH 2 % EX PADS: 6.0000 | MEDICATED_PAD | Freq: Once | CUTANEOUS | Status: DC

## 2021-01-29 MED ORDER — LACTATED RINGERS IV SOLN: INTRAVENOUS | Status: DC

## 2021-01-29 MED ORDER — FENTANYL CITRATE (PF) 100 MCG/2ML IJ SOLN
INTRAMUSCULAR | Status: AC
  Filled 2021-01-29: qty 2

## 2021-01-29 MED ORDER — BUPIVACAINE HCL (PF) 0.5 % IJ SOLN
INTRAMUSCULAR | Status: DC | PRN
  Administered 2021-01-29: 15 mL

## 2021-01-29 MED ORDER — PROPOFOL 10 MG/ML IV BOLUS
INTRAVENOUS | Status: AC
  Filled 2021-01-29: qty 20

## 2021-01-29 MED ORDER — FENTANYL CITRATE (PF) 250 MCG/5ML IJ SOLN
INTRAMUSCULAR | Status: AC
  Filled 2021-01-29: qty 5

## 2021-01-29 MED ORDER — TECHNETIUM TC 99M TILMANOCEPT KIT
1.0000 | PACK | Freq: Once | INTRAVENOUS | Status: AC | PRN
  Administered 2021-01-29: 1 via INTRADERMAL

## 2021-01-29 MED ORDER — METHYLENE BLUE 0.5 % INJ SOLN
INTRAVENOUS | Status: AC
  Filled 2021-01-29: qty 10

## 2021-01-29 MED ORDER — GABAPENTIN 300 MG PO CAPS
300.0000 mg | ORAL_CAPSULE | ORAL | Status: AC
  Administered 2021-01-29: 300 mg via ORAL
  Filled 2021-01-29: qty 1

## 2021-01-29 MED ORDER — BUPIVACAINE HCL (PF) 0.25 % IJ SOLN
INTRAMUSCULAR | Status: AC
  Filled 2021-01-29: qty 30

## 2021-01-29 MED ORDER — 0.9 % SODIUM CHLORIDE (POUR BTL) OPTIME
TOPICAL | Status: DC | PRN
  Administered 2021-01-29: 1000 mL

## 2021-01-29 MED ORDER — PROPOFOL 500 MG/50ML IV EMUL
INTRAVENOUS | Status: DC | PRN
  Administered 2021-01-29: 50 ug/kg/min via INTRAVENOUS

## 2021-01-29 MED ORDER — SODIUM CHLORIDE (PF) 0.9 % IJ SOLN
INTRAMUSCULAR | Status: AC
  Filled 2021-01-29: qty 10

## 2021-01-29 SURGICAL SUPPLY — 36 items
APPLIER CLIP 9.375 MED OPEN (MISCELLANEOUS) ×2
BINDER BREAST LRG (GAUZE/BANDAGES/DRESSINGS) IMPLANT
BINDER BREAST XLRG (GAUZE/BANDAGES/DRESSINGS) ×2 IMPLANT
CANISTER SUCT 3000ML PPV (MISCELLANEOUS) ×2 IMPLANT
CHLORAPREP W/TINT 26 (MISCELLANEOUS) ×2 IMPLANT
CLIP APPLIE 9.375 MED OPEN (MISCELLANEOUS) ×1 IMPLANT
CNTNR URN SCR LID CUP LEK RST (MISCELLANEOUS) ×1 IMPLANT
CONT SPEC 4OZ STRL OR WHT (MISCELLANEOUS) ×2
COVER PROBE W GEL 5X96 (DRAPES) ×2 IMPLANT
COVER SURGICAL LIGHT HANDLE (MISCELLANEOUS) IMPLANT
COVER WAND RF STERILE (DRAPES) IMPLANT
DERMABOND ADVANCED (GAUZE/BANDAGES/DRESSINGS) ×1
DERMABOND ADVANCED .7 DNX12 (GAUZE/BANDAGES/DRESSINGS) ×1 IMPLANT
DEVICE DUBIN SPECIMEN MAMMOGRA (MISCELLANEOUS) ×2 IMPLANT
DRAPE CHEST BREAST 15X10 FENES (DRAPES) ×2 IMPLANT
DRSG PAD ABDOMINAL 8X10 ST (GAUZE/BANDAGES/DRESSINGS) ×2 IMPLANT
ELECT COATED BLADE 2.86 ST (ELECTRODE) ×2 IMPLANT
ELECT REM PT RETURN 9FT ADLT (ELECTROSURGICAL) ×2
ELECTRODE REM PT RTRN 9FT ADLT (ELECTROSURGICAL) ×1 IMPLANT
GLOVE BIO SURGEON STRL SZ7.5 (GLOVE) ×4 IMPLANT
GOWN STRL REUS W/ TWL LRG LVL3 (GOWN DISPOSABLE) ×2 IMPLANT
GOWN STRL REUS W/TWL LRG LVL3 (GOWN DISPOSABLE) ×4
KIT BASIN OR (CUSTOM PROCEDURE TRAY) ×2 IMPLANT
KIT MARKER MARGIN INK (KITS) ×2 IMPLANT
LIGHT WAVEGUIDE WIDE FLAT (MISCELLANEOUS) IMPLANT
NEEDLE 18GX1X1/2 (RX/OR ONLY) (NEEDLE) IMPLANT
NEEDLE FILTER BLUNT 18X 1/2SAF (NEEDLE)
NEEDLE FILTER BLUNT 18X1 1/2 (NEEDLE) IMPLANT
NEEDLE HYPO 25GX1X1/2 BEV (NEEDLE) ×2 IMPLANT
NS IRRIG 1000ML POUR BTL (IV SOLUTION) ×2 IMPLANT
PACK GENERAL/GYN (CUSTOM PROCEDURE TRAY) ×2 IMPLANT
SUT MNCRL AB 4-0 PS2 18 (SUTURE) ×4 IMPLANT
SUT VIC AB 3-0 SH 18 (SUTURE) ×2 IMPLANT
SYR CONTROL 10ML LL (SYRINGE) ×2 IMPLANT
TOWEL GREEN STERILE (TOWEL DISPOSABLE) ×2 IMPLANT
TOWEL GREEN STERILE FF (TOWEL DISPOSABLE) ×2 IMPLANT

## 2021-01-29 NOTE — Interval H&P Note (Signed)
History and Physical Interval Note:  01/29/2021 7:31 AM  [name]  has presented today for surgery, with the diagnosis of RIGHT BREAST CANCER.  The various methods of treatment have been discussed with the patient and family. After consideration of risks, benefits and other options for treatment, the patient has consented to  Procedure(s) with comments: RIGHT BREAST LUMPECTOMY WITH RADIOACTIVE SEED AND SENTINEL LYMPH NODE BIOPSY (Right) - START TIME OF 7:30 AM FOR 90 MINUTES ROOM 1 as a surgical intervention.  The patient's history has been reviewed, patient examined, no change in status, stable for surgery.  I have reviewed the patient's chart and labs.  Questions were answered to the patient's satisfaction.     Autumn Messing III

## 2021-01-29 NOTE — Anesthesia Procedure Notes (Signed)
Procedure Name: LMA Insertion Date/Time: 01/29/2021 8:06 AM Performed by: Imagene Riches, CRNA Pre-anesthesia Checklist: Patient identified, Emergency Drugs available, Suction available and Patient being monitored Patient Re-evaluated:Patient Re-evaluated prior to induction Oxygen Delivery Method: Circle System Utilized Preoxygenation: Pre-oxygenation with 100% oxygen Induction Type: IV induction Ventilation: Mask ventilation without difficulty LMA: LMA with gastric port inserted LMA Size: 4.0 Number of attempts: 1 Airway Equipment and Method: Bite block Placement Confirmation: positive ETCO2 Tube secured with: Tape Dental Injury: Teeth and Oropharynx as per pre-operative assessment  Comments: Unable to seat traditional LMA. Used LMA supreme

## 2021-01-29 NOTE — H&P (Signed)
[name]  Location: [facility] Patient #: [id number] DOB: 11-19-1940 Married / Language: English / Race: White Female   History of Present Illness The patient is a 80 year old female who presents with breast cancer. We are asked to see the patient in consultation by Dr. Derrek Monaco to evaluate her for a new right breast cancer. The patient is a 80 year old white female who initially had a mammogram 7 months ago that showed this mass which measured 1.1 cm. At that time the edges were well-circumscribed and it was felt to be a fibroadenoma. On follow-up mammogram the edges became irregular and it measures 1.3 cm. The lymph nodes looked normal. The mass was biopsied and came back as an invasive breast cancer that was ER and PR positive and HER-2 negative with a Ki-67 of 15%. She is otherwise in good health. She quit smoking about 50 years ago. She does have a family history of breast cancer in a sister and lung cancer in a mother and father who were smokers.   Past Surgical History  Cataract Surgery  Bilateral. Colon Polyp Removal - Colonoscopy  Colon Polyp Removal - Open  Gallbladder Surgery - Laparoscopic  Tonsillectomy   Diagnostic Studies History  Colonoscopy  1-5 years ago Mammogram  >3 years ago  Allergies  No Known Drug Allergies   Medication History  Losartan Potassium (25MG Tablet, Oral) Active. busPIRone HCl (7.5MG Tablet, Oral) Active. Medications Reconciled  Social History  Caffeine use  Tea. No drug use  Tobacco use  Former smoker.  Family History  Arthritis  Mother, Sister. Breast Cancer  Sister. Cerebrovascular Accident  Father. Heart Disease  Father. Heart disease in female family member before age 60  Melanoma  Sister.  Pregnancy / Birth History  Age at menarche  33 years. Age of menopause  75-55 Gravida  0  Other Problems Anxiety Disorder  Arthritis  Breast Cancer  Cholelithiasis  High blood  pressure  Hypercholesterolemia  Lump In Breast     Review of Systems General Not Present- Appetite Loss, Chills, Fatigue, Fever, Night Sweats, Weight Gain and Weight Loss. Skin Not Present- Change in Wart/Mole, Dryness, Hives, Jaundice, New Lesions, Non-Healing Wounds, Rash and Ulcer. HEENT Not Present- Earache, Hearing Loss, Hoarseness, Nose Bleed, Oral Ulcers, Ringing in the Ears, Seasonal Allergies, Sinus Pain, Sore Throat, Visual Disturbances, Wears glasses/contact lenses and Yellow Eyes. Breast Present- Breast Mass. Not Present- Breast Pain, Nipple Discharge and Skin Changes. Cardiovascular Not Present- Chest Pain, Difficulty Breathing Lying Down, Leg Cramps, Palpitations, Rapid Heart Rate, Shortness of Breath and Swelling of Extremities. Gastrointestinal Not Present- Abdominal Pain, Bloating, Bloody Stool, Change in Bowel Habits, Chronic diarrhea, Constipation, Difficulty Swallowing, Excessive gas, Gets full quickly at meals, Hemorrhoids, Indigestion, Nausea, Rectal Pain and Vomiting. Female Genitourinary Not Present- Frequency, Nocturia, Painful Urination, Pelvic Pain and Urgency. Musculoskeletal Not Present- Back Pain, Joint Pain, Joint Stiffness, Muscle Pain, Muscle Weakness and Swelling of Extremities. Neurological Not Present- Decreased Memory, Fainting, Headaches, Numbness, Seizures, Tingling, Tremor, Trouble walking and Weakness. Psychiatric Present- Anxiety and Change in Sleep Pattern. Not Present- Bipolar, Depression, Fearful and Frequent crying. Endocrine Not Present- Cold Intolerance, Excessive Hunger, Hair Changes, Heat Intolerance, Hot flashes and New Diabetes. Hematology Not Present- Blood Thinners, Easy Bruising, Excessive bleeding, Gland problems, HIV and Persistent Infections.  Vitals  Weight: 170.13 lb Height: 63in Body Surface Area: 1.81 m Body Mass Index: 30.14 kg/m  Pulse: 90 (Regular)  BP: 138/86(Sitting, Left Arm, Standard)  Physical Exam   General Mental Status-Alert. General Appearance-Consistent with stated age. Hydration-Well hydrated. Voice-Normal.  Head and Neck Head-normocephalic, atraumatic with no lesions or palpable masses. Trachea-midline. Thyroid Gland Characteristics - normal size and consistency.  Eye Eyeball - Bilateral-Extraocular movements intact. Sclera/Conjunctiva - Bilateral-No scleral icterus.  Chest and Lung Exam Chest and lung exam reveals -quiet, even and easy respiratory effort with no use of accessory muscles and on auscultation, normal breath sounds, no adventitious sounds and normal vocal resonance. Inspection Chest Wall - Normal. Back - normal.  Breast Note: There is no palpable mass in either breast. There is no palpable axillary, supraclavicular, or cervical lymphadenopathy.   Cardiovascular Cardiovascular examination reveals -normal heart sounds, regular rate and rhythm with no murmurs and normal pedal pulses bilaterally.  Abdomen Inspection Inspection of the abdomen reveals - No Hernias. Skin - Scar - no surgical scars. Palpation/Percussion Palpation and Percussion of the abdomen reveal - Soft, Non Tender, No Rebound tenderness, No Rigidity (guarding) and No hepatosplenomegaly. Auscultation Auscultation of the abdomen reveals - Bowel sounds normal.  Neurologic Neurologic evaluation reveals -alert and oriented x 3 with no impairment of recent or remote memory. Mental Status-Normal.  Musculoskeletal Normal Exam - Left-Upper Extremity Strength Normal and Lower Extremity Strength Normal. Normal Exam - Right-Upper Extremity Strength Normal and Lower Extremity Strength Normal.  Lymphatic Head & Neck  General Head & Neck Lymphatics: Bilateral - Description - Normal. Axillary  General Axillary Region: Bilateral - Description - Normal. Tenderness - Non Tender. Femoral & Inguinal  Generalized Femoral & Inguinal Lymphatics: Bilateral - Description  - Normal. Tenderness - Non Tender.    Assessment & Plan MALIGNANT NEOPLASM OF UPPER-OUTER QUADRANT OF RIGHT BREAST IN FEMALE, ESTROGEN RECEPTOR POSITIVE (C50.411) Impression: The patient appears to have a 1.3 cm cancer in the upper outer quadrant of the right breast with clinically negative nodes. We have discussed in detail the different options for treatment and at this point she favors breast conservation with sentinel node biopsy. I have discussed with her in detail the risks and benefits of the operation as well as some of the technical aspects including the use of a radioactive seed for localization and she understands and wishes to proceed. I will refer her to medical and radiation oncology to discuss adjuvant therapy. Since this was seen about 7 months ago I will go ahead and start her on tamoxifen to help with her peace of mind. This can be adjusted by her medical oncologist. This patient encounter took 60 minutes today to perform the following: take history, perform exam, review outside records, interpret imaging, counsel the patient on their diagnosis and document encounter, findings & plan in the EHR Current Plans Referred to Oncology, for evaluation and follow up (Oncology). Routine.

## 2021-01-29 NOTE — Transfer of Care (Signed)
Immediate Anesthesia Transfer of Care Note  Patient: [name]  Procedure(s) Performed: RIGHT BREAST LUMPECTOMY WITH RADIOACTIVE SEED AND SENTINEL LYMPH NODE BIOPSY (Right Breast)  Patient Location: PACU  Anesthesia Type:GA combined with regional for post-op pain  Level of Consciousness: drowsy  Airway & Oxygen Therapy: Patient Spontanous Breathing and Patient connected to nasal cannula oxygen  Post-op Assessment: Report given to RN and Post -op Vital signs reviewed and stable  Post vital signs: Reviewed and stable  Last Vitals:  Vitals Value Taken Time  BP 128/68 01/29/21 0900  Temp 36.4 C 01/29/21 0900  Pulse 83 01/29/21 0900  Resp 15 01/29/21 0900  SpO2 100 % 01/29/21 0900  Vitals shown include unvalidated device data.  Last Pain:  Vitals:   01/29/21 0657  TempSrc:   PainSc: 0-No pain      Patients Stated Pain Goal: 2 (76/80/88 1103)  Complications: No complications documented.

## 2021-01-29 NOTE — Op Note (Signed)
01/29/2021  8:55 AM  PATIENT:  [name]  80 y.o. female  PRE-OPERATIVE DIAGNOSIS:  RIGHT BREAST CANCER  POST-OPERATIVE DIAGNOSIS:  RIGHT BREAST CANCER  PROCEDURE:  Procedure(s) with comments: RIGHT BREAST LUMPECTOMY WITH RADIOACTIVE SEED LOCALIZATION AND DEEP RIGHT AXILLARY SENTINEL LYMPH NODE BIOPSY   SURGEON:  Surgeon(s) and Role:    * Jovita Kussmaul, MD - Primary  PHYSICIAN ASSISTANT:   ASSISTANTS: none   ANESTHESIA:   local and general  EBL:  10 mL   BLOOD ADMINISTERED:none  DRAINS: none   LOCAL MEDICATIONS USED:  MARCAINE     SPECIMEN:  Source of Specimen:  Right breast tissue with additional inferior margin and sentinel node  DISPOSITION OF SPECIMEN:  PATHOLOGY  COUNTS:  YES  TOURNIQUET:  * No tourniquets in log *  DICTATION: .Dragon Dictation   After informed consent was obtained the patient was brought to the operating room and placed in the supine position on the operating table.  After adequate induction of general anesthesia the patient's right chest, breast, and axillary area were prepped with ChloraPrep, allowed to dry, and draped in usual sterile manner.  An appropriate timeout was performed.  Previously an I-125 seed was placed in the outer aspect of the right breast to mark an area of invasive breast cancer.  Also earlier in the day the patient underwent injection of 1 mCi of technetium sulfur colloid in the subareolar position on the right.  The neoprobe was set to I-125 in the area of radioactivity was readily identified.  I elected to make an elliptical incision in the skin overlying the area of radioactivity with a 15 blade knife.  The incision was carried through the skin and subcutaneous tissue sharply with the electrocautery.  Dissection was then carried around the radioactive seed while checking the area of radioactivity frequently.  This dissection was carried all the way to the chest wall.  Once the specimen was removed it was oriented with the  appropriate paint colors.  A specimen radiograph was obtained that showed the clip and seed to be within the specimen.  I did elect to take an additional inferior margin and this was marked appropriately.  The tissue was then sent to pathology for further evaluation.  Hemostasis was achieved using the Bovie electrocautery.  The wound was irrigated with saline and infiltrated with more quarter percent Marcaine.  The cavity was marked with clips.  The deep layer of the wound was then closed with layers of interrupted 3-0 Vicryl stitches.  The skin was closed with a running 4-0 Monocryl subcuticular stitch.  Attention was then turned to the right axilla.  The neoprobe was set to technetium and there was a good signal identified.  The area was infiltrated with quarter percent Marcaine.  A small transversely oriented incision was made with a 15 blade knife overlying the area of radioactivity.  The incision was carried through the skin and subcutaneous tissue sharply with the electrocautery until the deep right axillary space was entered.  The neoprobe was then used to direct blunt hemostat dissection.  I was able to identify a hot lymph node.  This was excised sharply with the electrocautery and the surrounding small vessels and lymphatics were controlled with clips.  Ex vivo counts on this node were approximately 300.  No other hot or palpable nodes were identified in the right axilla.  Hemostasis was achieved using the Bovie electrocautery.  The deep layer of the wound was closed with interrupted 3-0  Vicryl stitches.  The skin was then closed with a running 4-0 Monocryl subcuticular stitch.  Dermabond dressings were applied.  The patient tolerated the procedure well.  At the end of the case all needle sponge and instrument counts were correct.  The patient was then awakened and taken recovery in stable condition.  PLAN OF CARE: Discharge to home after PACU  PATIENT DISPOSITION:  PACU - hemodynamically stable.    Delay start of Pharmacological VTE agent (>24hrs) due to surgical blood loss or risk of bleeding: not applicable

## 2021-01-29 NOTE — Progress Notes (Signed)
Nuclear medicine contacted and states they will arrive to floor shortly

## 2021-01-29 NOTE — Anesthesia Procedure Notes (Signed)
Anesthesia Regional Block: Pectoralis block   Pre-Anesthetic Checklist: ,, timeout performed, Correct Patient, Correct Site, Correct Laterality, Correct Procedure, Correct Position, site marked, Risks and benefits discussed,  Surgical consent,  Pre-op evaluation,  At surgeon's request and post-op pain management  Laterality: Right  Prep: chloraprep       Needles:  Injection technique: Single-shot  Needle Type: Echogenic Needle     Needle Length: 10cm  Needle Gauge: 21     Additional Needles:   Narrative:  Start time: 01/29/2021 7:10 AM End time: 01/29/2021 7:13 AM Injection made incrementally with aspirations every 5 mL.  Performed by: Personally  Anesthesiologist: Audry Pili, MD  Additional Notes: No pain on injection. No increased resistance to injection. Injection made in 5cc increments. Good needle visualization. Patient tolerated the procedure well.

## 2021-02-01 ENCOUNTER — Encounter: Payer: Self-pay | Admitting: *Deleted

## 2021-02-01 ENCOUNTER — Encounter (HOSPITAL_COMMUNITY): Payer: Self-pay | Admitting: General Surgery

## 2021-02-03 LAB — SURGICAL PATHOLOGY

## 2021-02-03 NOTE — Anesthesia Postprocedure Evaluation (Signed)
Anesthesia Post Note  Patient: [name]  Procedure(s) Performed: RIGHT BREAST LUMPECTOMY WITH RADIOACTIVE SEED AND SENTINEL LYMPH NODE BIOPSY (Right Breast)     Patient location during evaluation: PACU Anesthesia Type: General Level of consciousness: awake and alert Pain management: pain level controlled Vital Signs Assessment: post-procedure vital signs reviewed and stable Respiratory status: spontaneous breathing, nonlabored ventilation, respiratory function stable and patient connected to nasal cannula oxygen Cardiovascular status: blood pressure returned to baseline and stable Postop Assessment: no apparent nausea or vomiting Anesthetic complications: no   No complications documented.  Last Vitals:  Vitals:   01/29/21 0922 01/29/21 0930  BP:  (!) 146/78  Pulse: 70 68  Resp: 12 14  Temp:  36.4 C  SpO2: 99% 100%    Last Pain:  Vitals:   01/29/21 0930  TempSrc:   PainSc: 1                  Taevin Mcferran

## 2021-02-04 ENCOUNTER — Encounter: Payer: Self-pay | Admitting: *Deleted

## 2021-02-04 ENCOUNTER — Other Ambulatory Visit: Payer: Self-pay | Admitting: Licensed Clinical Social Worker

## 2021-02-04 DIAGNOSIS — C50411 Malignant neoplasm of upper-outer quadrant of right female breast: Secondary | ICD-10-CM

## 2021-02-04 DIAGNOSIS — Z170 Estrogen receptor positive status [ER+]: Secondary | ICD-10-CM

## 2021-02-08 ENCOUNTER — Other Ambulatory Visit: Payer: Medicare Other

## 2021-02-08 ENCOUNTER — Other Ambulatory Visit: Payer: Self-pay

## 2021-02-08 ENCOUNTER — Encounter: Payer: Self-pay | Admitting: Licensed Clinical Social Worker

## 2021-02-08 ENCOUNTER — Inpatient Hospital Stay (HOSPITAL_BASED_OUTPATIENT_CLINIC_OR_DEPARTMENT_OTHER): Payer: Medicare Other | Admitting: Licensed Clinical Social Worker

## 2021-02-08 ENCOUNTER — Inpatient Hospital Stay: Payer: Medicare Other

## 2021-02-08 DIAGNOSIS — Z803 Family history of malignant neoplasm of breast: Secondary | ICD-10-CM | POA: Diagnosis not present

## 2021-02-08 DIAGNOSIS — Z800 Family history of malignant neoplasm of digestive organs: Secondary | ICD-10-CM | POA: Diagnosis not present

## 2021-02-08 DIAGNOSIS — C50411 Malignant neoplasm of upper-outer quadrant of right female breast: Secondary | ICD-10-CM

## 2021-02-08 DIAGNOSIS — Z170 Estrogen receptor positive status [ER+]: Secondary | ICD-10-CM | POA: Diagnosis not present

## 2021-02-08 DIAGNOSIS — Z1379 Encounter for other screening for genetic and chromosomal anomalies: Secondary | ICD-10-CM

## 2021-02-08 NOTE — Progress Notes (Signed)
REFERRING PROVIDER: Chauncey Cruel, MD [address] [place],  [place] [address]  PRIMARY PROVIDER:  Celene Squibb, MD  PRIMARY REASON FOR VISIT:  1. Malignant neoplasm of upper-outer quadrant of right breast in female, estrogen receptor positive ([place])   2. Family history of breast cancer   3. Family history of pancreatic cancer      HISTORY OF PRESENT ILLNESS:   [name], a 80 y.o. female, was seen for a [facility] cancer genetics consultation at the request of Dr. Jana Hakim due to a personal and family history of breast cancer.  [name] presents to clinic today to discuss the possibility of a hereditary predisposition to cancer, genetic testing, and to further clarify her future cancer risks, as well as potential cancer risks for family members.   In 2022, at the age of 50, [name] was diagnosed with right breast cancer, ER/PR+, Her2-. Treatment plan includes surgery (completed on 3/15, lumpectomy), adjuvant radiation and adjuvant antiestrogen therapy.   CANCER HISTORY:  Oncology History  Malignant neoplasm of upper-outer quadrant of right breast in female, estrogen receptor positive ([place])  01/19/2021 Initial Diagnosis   Malignant neoplasm of upper-outer quadrant of right breast in female, estrogen receptor positive ([place])   01/19/2021 Cancer Staging   Staging form: Breast, AJCC 8th Edition - Clinical: cT1c, cN0, cM0, GX, ER+, PR+, HER2- - Signed by Chauncey Cruel, MD on 01/19/2021      RISK FACTORS:  Menarche was at age 53.  OCP use for approximately 0 years.  Ovaries intact: yes.  Hysterectomy: no.  Menopausal status: postmenopausal.  HRT use: 0 years. Colonoscopy: yes; normal. Mammogram within the last year: yes. Number of breast biopsies: 2. Up to date with pelvic exams: yes. Any excessive radiation exposure in the past: no  Past Medical History:  Diagnosis Date  . Anxiety   . 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  Breast mass, left 08/24/2015  . Family history of breast cancer   . Family history of pancreatic cancer   . Hypertension   . Positive fecal occult blood test 02/20/2014   Will send 3 cards home    Past Surgical History:  Procedure Laterality Date  . BREAST CYST EXCISION    . BREAST EXCISIONAL BIOPSY Bilateral >10+ years ago   benign  . BREAST LUMPECTOMY WITH RADIOACTIVE SEED AND SENTINEL LYMPH NODE BIOPSY Right 01/29/2021   Procedure: RIGHT BREAST LUMPECTOMY WITH RADIOACTIVE SEED AND SENTINEL LYMPH NODE BIOPSY;  Surgeon: Jovita Kussmaul, MD;  Location: [facility];  Service: General;  Laterality: Right;  START TIME OF 7:30 AM FOR 90 MINUTES ROOM 1  . BREAST SURGERY Left    partail mastectomy-benign  . CATARACT EXTRACTION, BILATERAL     [place]  . CHOLECYSTECTOMY N/A 11/29/2013   Procedure: LAPAROSCOPIC CHOLECYSTECTOMY;  Surgeon: Scherry Ran, MD;  [facility];  Service: General;  Laterality: N/A;  . COLONOSCOPY N/A 03/14/2014   Procedure: COLONOSCOPY;  Surgeon: Rogene Houston, MD;  [facility];  Service: Endoscopy;  Laterality: N/A;  1250  . COLONOSCOPY N/A 12/20/2019   Procedure: COLONOSCOPY;  Surgeon: Rogene Houston, MD;  [facility];  Service: Endoscopy;  Laterality: N/A;  1020  . EYE SURGERY     Bilateral cataracts  . MASTECTOMY     Partial left breast  . POLYPECTOMY  12/20/2019   Procedure: POLYPECTOMY;  Surgeon: Rogene Houston, MD;  [facility];  Service: Endoscopy;;  . TONSILLECTOMY      Social History   Socioeconomic History  . Marital status: Married    Spouse name: Not on file  . Number of children: Not on file  . Years of education: Not on file  . Highest education level: Not on file  Occupational History  . Not on file  Tobacco Use  . Smoking status: Former Smoker    Packs/day: 0.50    Years: 4.00    Pack years: 2.00    Types: Cigarettes    Quit date: 11/29/1967    Years since quitting: 53.2  . Smokeless tobacco:  Never Used  Vaping Use  . Vaping Use: Never used  Substance and Sexual Activity  . Alcohol use: Yes    Alcohol/week: 5.0 standard drinks    Types: 5 Glasses of wine per week    Comment: social  . Drug use: No  . Sexual activity: Not Currently    Birth control/protection: Post-menopausal  Other Topics Concern  . Not on file  Social History Narrative  . Not on file   Social Determinants of Health   Financial Resource Strain: Not on file  Food Insecurity: Not on file  Transportation Needs: Not on file  Physical Activity: Not on file  Stress: Not on file  Social Connections: Not on file     FAMILY HISTORY:  We obtained a detailed, 4-generation family history.  Significant diagnoses are listed below: Family History  Problem Relation Age of Onset  . Cancer Mother        lung  . Cancer Father        lung  . Hypertension Father   . Heart disease Maternal Grandfather   . Heart attack Maternal Grandfather   . Heart disease Paternal Grandfather   . Heart attack Paternal Grandfather   . Cancer Other        breast dx 28  . Arthritis Sister        rheumatoid  . COPD Sister   . Breast cancer Sister 42  . Breast cancer Cousin        dx 38s  . Pancreatic cancer Cousin        d. 28s   [name] has an adopted son and daughter, in their 45s. She has two sisters and one brother. One of her sisters had breast cancer in her 65s. A different sister had a daughter who had breast cancer at 22 and is living in her 82s. Patient is unsure if either of them have had genetic testing.   [name] mother died of lung cancer at 7, history of smoking. A maternal cousin had breast cancer. No other known cancers on this side of the family.  [name] father had lung cancer as well and died at 87. Patient had two paternal cousins who had pancreatic cancer and died in their 23s. No other known cancers on this side of the family.  [name] is unaware of previous family history of genetic  testing for hereditary cancer risks. Patient's maternal ancestors are of [place] descent, and paternal ancestors are of [place] descent. There is no reported Ashkenazi Jewish ancestry. There is no known consanguinity.    GENETIC COUNSELING ASSESSMENT: Ms. Sherry is a 80 y.o. female with a personal and family history of breast cancer which is somewhat suggestive of a hereditary cancer syndrome and predisposition to cancer. We, therefore, discussed and recommended the following at today's visit.   DISCUSSION: We  discussed that approximately 5-10% of breast cancer is hereditary  Most cases of hereditary breast cancer are associated with BRCA1/BRCA2 genes, although there are other genes associated with hereditary breast cancer as well as genes associated with pancreatic cancer.  We discussed that testing is beneficial for several reasons including knowing about other cancer risks, identifying potential screening and risk-reduction options that may be appropriate, and to understand if other family members could be at risk for cancer and allow them to undergo genetic testing.   We reviewed the characteristics, features and inheritance patterns of hereditary cancer syndromes. We also discussed genetic testing, including the appropriate family members to test, the process of testing, insurance coverage and turn-around-time for results. We discussed the implications of a negative, positive and/or variant of uncertain significant result. We recommended Ms. Napierala pursue genetic testing for the [facility] Common Hereditary Cancers gene panel.   The Common Hereditary Cancers Panel + RNA offered by [facility] includes sequencing and/or deletion duplication testing of the following 47 genes: APC, ATM, AXIN2, BARD1, BMPR1A, BRCA1, BRCA2, BRIP1, CDH1, CDKN2A (p14ARF), CDKN2A (p16INK4a), CKD4, CHEK2, CTNNA1, DICER1, EPCAM (Deletion/duplication testing only), GREM1 (promoter region deletion/duplication testing only), KIT, MEN1,  MLH1, MSH2, MSH3, MSH6, MUTYH, NBN, NF1, NHTL1, PALB2, PDGFRA, PMS2, POLD1, POLE, PTEN, RAD50, RAD51C, RAD51D, SDHB, SDHC, SDHD, SMAD4, SMARCA4. STK11, TP53, TSC1, TSC2, and VHL.  The following genes were evaluated for sequence changes only: SDHA and HOXB13 c.251G>A variant only.  Based on Ms. Bardwell's personal and family history of cancer, she meets medical criteria for genetic testing. Despite that she meets criteria, she may still have an out of pocket cost. We discussed that if her out of pocket cost for testing is over $100, the laboratory will call and confirm whether she wants to proceed with testing.  If the out of pocket cost of testing is less than $100 she will be billed by the genetic testing laboratory.   PLAN:  Despite our recommendation, Ms. Wass did not wish to pursue genetic testing at today's visit. We understand this decision and remain available to coordinate genetic testing at any time in the future. We, therefore, recommend Ms. Schnarr continue to follow the cancer screening guidelines given by her primary healthcare provider.  Based on Ms. Leisure's family history, we recommended her niece/sister and those related more closely to her cousins with pancreatic cancer have genetic counseling and testing if they have not yet. Ms. Irmen will let us know if we can be of any assistance in coordinating genetic counseling and/or testing for this family member.   Lastly, we encouraged Ms. Trembley to remain in contact with cancer genetics annually so that we can continuously update the family history and inform her of any changes in cancer genetics and testing that may be of benefit for this family.   Ms. Poulton questions were answered to her satisfaction today. Our contact information was provided should additional questions or concerns arise. Thank you for the referral and allowing [place] to share in the care of your patient.   Faith Rogue, MS, [facility] Genetic  Counselor [email or web address] Phone: [phone]  The patient was seen for a total of 25 minutes in face-to-face genetic counseling. [employer] intern Magda Paganini was present and assisted with this case as well as patient's husband.  Dr. Grayland Ormond was available for discussion regarding this case.   _______________________________________________________________________ For Office Staff:  Number of people involved in session: 3 Was an Intern/ student involved with case: yes

## 2021-03-03 ENCOUNTER — Other Ambulatory Visit: Payer: Self-pay

## 2021-03-03 ENCOUNTER — Ambulatory Visit
Admission: RE | Admit: 2021-03-03 | Discharge: 2021-03-03 | Disposition: A | Discharge status: Home / Self Care | Payer: Medicare Other | Source: Ambulatory Visit | Attending: Radiation Oncology | Admitting: Radiation Oncology

## 2021-03-03 ENCOUNTER — Encounter: Payer: Self-pay | Admitting: Radiation Oncology

## 2021-03-03 VITALS — BP 149/78 | HR 80 | Temp 97.1°F | Resp 18 | Ht 63.0 in | Wt 170.5 lb

## 2021-03-03 DIAGNOSIS — C50411 Malignant neoplasm of upper-outer quadrant of right female breast: Secondary | ICD-10-CM | POA: Diagnosis not present

## 2021-03-03 DIAGNOSIS — Z170 Estrogen receptor positive status [ER+]: Secondary | ICD-10-CM | POA: Diagnosis not present

## 2021-03-03 DIAGNOSIS — Z8052 Family history of malignant neoplasm of bladder: Secondary | ICD-10-CM | POA: Insufficient documentation

## 2021-03-03 DIAGNOSIS — Z803 Family history of malignant neoplasm of breast: Secondary | ICD-10-CM | POA: Insufficient documentation

## 2021-03-03 DIAGNOSIS — I10 Essential (primary) hypertension: Secondary | ICD-10-CM | POA: Diagnosis not present

## 2021-03-03 DIAGNOSIS — Z87891 Personal history of nicotine dependence: Secondary | ICD-10-CM | POA: Insufficient documentation

## 2021-03-03 DIAGNOSIS — Z79899 Other long term (current) drug therapy: Secondary | ICD-10-CM | POA: Insufficient documentation

## 2021-03-03 DIAGNOSIS — Z801 Family history of malignant neoplasm of trachea, bronchus and lung: Secondary | ICD-10-CM | POA: Diagnosis not present

## 2021-03-03 NOTE — Addendum Note (Signed)
Encounter addended by: Hayden Pedro, PA-C on: 03/03/2021 11:28 AM  Actions taken: Clinical Note Signed

## 2021-03-03 NOTE — Progress Notes (Addendum)
Radiation Oncology         [phone] ________________________________  Follow Up  Name: [name]        MRN: [id number]  Date of Service: 03/03/2021 DOB: 01/30/1941  AQ:TMAU, Edwinna Areola, MD  Magrinat, Virgie Dad, MD     REFERRING PHYSICIAN: Magrinat, Virgie Dad, MD   DIAGNOSIS: The encounter diagnosis was Malignant neoplasm of upper-outer quadrant of right breast in female, estrogen receptor positive ([place] [place]).   HISTORY OF PRESENT ILLNESS: [name] is a 80 y.o. female with a diagnosis of right breast cancer. The patient was noted to have a screening detected abnormality in the right breast that was  and was set up for a 6 month recall diagnostic mammogram that showed a 1.3 cm mass without adenopathy.  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 No grade was assigned. The tumor was ER/PR positive HER2 negative with a Ki 67 of 15%. Since the patient's last visit, she underwent lumpectomy on 01/29/2021 which revealed a grade 2 invasive ductal carcinoma with extracellular mucin there was associated intermediate-high grade DCIS.  Her lateral margin was less than 1 mm from invasive and in situ disease and 2 sampled lymph nodes were negative.  Her case was discussed in multidisciplinary breast oncology conference and no additional surgery was recommended, no systemic therapy is felt to be required, and she is seen today to discuss adjuvant radiotherapy.   PREVIOUS RADIATION THERAPY: No   PAST MEDICAL HISTORY:  Past Medical History:  Diagnosis Date  . Anxiety   . 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 Breast mass, left 08/24/2015  . Family history of breast cancer   . Family history of pancreatic cancer   . Hypertension   . Positive fecal occult blood test 02/20/2014   Will send 3 cards home       PAST SURGICAL HISTORY: Past Surgical History:  Procedure Laterality Date  . BREAST CYST EXCISION    .  BREAST EXCISIONAL BIOPSY Bilateral >10+ years ago   benign  . BREAST LUMPECTOMY WITH RADIOACTIVE SEED AND SENTINEL LYMPH NODE BIOPSY Right 01/29/2021   Procedure: RIGHT BREAST LUMPECTOMY WITH RADIOACTIVE SEED AND SENTINEL LYMPH NODE BIOPSY;  Surgeon: Jovita Kussmaul, MD;  Location: [facility];  Service: General;  Laterality: Right;  START TIME OF 7:30 AM FOR 90 MINUTES ROOM 1  . BREAST SURGERY Left    partail mastectomy-benign  . CATARACT EXTRACTION, BILATERAL     [place]  . CHOLECYSTECTOMY N/A 11/29/2013   Procedure: LAPAROSCOPIC CHOLECYSTECTOMY;  Surgeon: Scherry Ran, MD;  [facility];  Service: General;  Laterality: N/A;  . COLONOSCOPY N/A 03/14/2014   Procedure: COLONOSCOPY;  Surgeon: Rogene Houston, MD;  [facility];  Service: Endoscopy;  Laterality: N/A;  1250  . COLONOSCOPY N/A 12/20/2019   Procedure: COLONOSCOPY;  Surgeon: Rogene Houston, MD;  [facility];  Service: Endoscopy;  Laterality: N/A;  1020  . EYE SURGERY     Bilateral cataracts  . MASTECTOMY     Partial left breast  . POLYPECTOMY  12/20/2019   Procedure: POLYPECTOMY;  Surgeon: Rogene Houston, MD;  [facility];  Service: Endoscopy;;  . TONSILLECTOMY       FAMILY HISTORY:  Family History  Problem Relation Age of Onset  . Cancer Mother        lung  . Cancer Father  lung  . Hypertension Father   . Heart disease Maternal Grandfather   . Heart attack Maternal Grandfather   . Heart disease Paternal Grandfather   . Heart attack Paternal Grandfather   . Cancer Other        breast dx 51  . Arthritis Sister        rheumatoid  . COPD Sister   . Breast cancer Sister 3  . Breast cancer Cousin        dx 2s  . Pancreatic cancer Cousin        d. 23s     SOCIAL HISTORY:  reports that she quit smoking about 53 years ago. Her smoking use included cigarettes. She has a 2.00 pack-year smoking history. She has never used smokeless tobacco. She reports current alcohol use  of about 5.0 standard drinks of alcohol per week. She reports that she does not use drugs. She is married and lives in [place] and is very active. 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   ALLERGIES: Patient has no known allergies.   MEDICATIONS:  Current Outpatient Medications  Medication Sig Dispense Refill  . Ascorbic Acid (VITAMIN C GUMMIES PO) Take 250 mg by mouth daily.    . busPIRone (BUSPAR) 7.5 MG tablet Take 1 tablet (7.5 mg total) by mouth 3 (three) times daily. (Patient taking differently: Take 7.5 mg by mouth 2 (two) times daily.) 90 tablet 3  . Cholecalciferol (VITAMIN D3 GUMMIES ADULT PO) Take 2,000 Units by mouth daily.    Marland Kitchen HYDROcodone-acetaminophen (NORCO/VICODIN) 5-325 MG tablet Take 1-2 tablets by mouth every 6 (six) hours as needed for moderate pain or severe pain. 10 tablet 0  . losartan (COZAAR) 25 MG tablet Take 25 mg by mouth daily.    . hydrOXYzine (ATARAX/VISTARIL) 10 MG tablet Take 1 tablet (10 mg total) by mouth 3 (three) times daily as needed. For increased anxiety (Patient not taking: No sig reported) 30 tablet 0  . tamoxifen (NOLVADEX) 20 MG tablet Take 20 mg by mouth daily. (Patient not taking: No sig reported)     No current facility-administered medications for this encounter.     REVIEW OF SYSTEMS: On review of systems, the patient reports that she is doing well overall. She feels that she's done well since surgery and only took pain medicine for a very short few days. She denies any specific concerns about her diagnosis. She reports her blood pressure is high likely from anxiety about treatment but denies chest pain, shortness of breath, headaches, or changes in vision.     PHYSICAL EXAM:  Wt Readings from Last 3 Encounters:  03/03/21 170 lb 8 oz (77.3 kg)  01/29/21 170 lb (77.1 kg)  01/22/21 170 lb 8 oz (77.3 kg)   Temp Readings from Last 3 Encounters:  03/03/21 (!) 97.1 F (36.2 C) (Temporal)  01/29/21 97.6 F (36.4  C)  01/22/21 98 F (36.7 C) (Oral)   BP Readings from Last 3 Encounters:  03/03/21 (!) 149/78  01/29/21 (!) 146/78  01/22/21 (!) 157/86   Pulse Readings from Last 3 Encounters:  03/03/21 80  01/29/21 68  01/22/21 81   In general this is a well appearing caucasian female in no acute distress. She's alert and oriented x4 and appropriate throughout the examination. Cardiopulmonary assessment is negative for acute distress and she exhibits normal effort. Breast exam reveals a well healed lateral right breast incision site with visible dermabond in place. No erythema or separation of  the incision site is noted.   ECOG = 0  0 - Asymptomatic (Fully active, able to carry on all predisease activities without restriction)  1 - Symptomatic but completely ambulatory (Restricted in physically strenuous activity but ambulatory and able to carry out work of a light or sedentary nature. For example, light housework, office work)  2 - Symptomatic, <50% in bed during the day (Ambulatory and capable of all self care but unable to carry out any work activities. Up and about more than 50% of waking hours)  3 - Symptomatic, >50% in bed, but not bedbound (Capable of only limited self-care, confined to bed or chair 50% or more of waking hours)  4 - Bedbound (Completely disabled. Cannot carry on any self-care. Totally confined to bed or chair)  5 - Death   Eustace Pen MM, Creech RH, Tormey DC, et al. [phone]). "Toxicity and response criteria of the [facility]". [place] Oncol. 5 (6): 649-55    LABORATORY DATA:  Lab Results  Component Value Date   WBC 8.0 01/19/2021   HGB 14.3 01/19/2021   HCT 43.4 01/19/2021   MCV 92.9 01/19/2021   PLT 243 01/19/2021   Lab Results  Component Value Date   NA 145 01/19/2021   K 3.7 01/19/2021   CL 111 01/19/2021   CO2 25 01/19/2021   Lab Results  Component Value Date   ALT 16 01/19/2021   AST 11 (L) 01/19/2021   ALKPHOS 70 01/19/2021    BILITOT 0.6 01/19/2021      RADIOGRAPHY: No results found.     IMPRESSION/PLAN: 1. 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 Dr. Lisbeth Renshaw discusses the final pathology findings and reviews the nature of right breast disease. She's done well since surgery and we reviewed the role of external radiotherapy to the breast  to reduce risks of local recurrence followed by antiestrogen therapy. She is also aware of some scenarios based on final pathology results to determine if radiotherapy may be optional rather than required, but she is still wanting to be aggressive for her treatment, and desires standard treatment with adjuvant radiotherapy. We discussed the risks, benefits, short, and long term effects of radiotherapy, as well as the curative intent, and the patient is interested in proceeding. Dr. Lisbeth Renshaw discusses the delivery and logistics of radiotherapy and recommends 4 weeks of radiotherapy. Written consent is obtained and placed in the chart, a copy was provided to the patient. She will simulate tomorrow morning. 2. Hypertension. The patient did not take her morning losartan. Her pressures were quite high but she denied symptoms. With rechecking this however it dropped to 146/89. She will take her medication when she gets home and will recheck this today and report any abnormalities to her PCP.   In a visit lasting 45 minutes, greater than 50% of the time was spent face to face discussing the patient's condition, in preparation for the discussion, and coordinating the patient's care.   The above documentation reflects my direct findings during this shared patient visit. Please see the separate note by Dr. Lisbeth Renshaw on this date for the remainder of the patient's plan of care.    Carola Rhine, [facility]    **Disclaimer: This note was dictated with voice recognition software. Similar sounding words can inadvertently be transcribed and this note may contain  transcription errors which may not have been corrected upon publication of note.**

## 2021-03-03 NOTE — Addendum Note (Signed)
Encounter addended by: Cori Razor, RN on: 03/03/2021 11:18 AM  Actions taken: Flowsheet accepted

## 2021-03-04 ENCOUNTER — Ambulatory Visit
Admission: RE | Admit: 2021-03-04 | Discharge: 2021-03-04 | Disposition: A | Discharge status: Home / Self Care | Payer: Medicare Other | Source: Ambulatory Visit | Attending: Radiation Oncology | Admitting: Radiation Oncology

## 2021-03-04 DIAGNOSIS — Z170 Estrogen receptor positive status [ER+]: Secondary | ICD-10-CM | POA: Insufficient documentation

## 2021-03-04 DIAGNOSIS — Z510 Encounter for antineoplastic radiation therapy: Secondary | ICD-10-CM | POA: Insufficient documentation

## 2021-03-04 DIAGNOSIS — C50411 Malignant neoplasm of upper-outer quadrant of right female breast: Secondary | ICD-10-CM | POA: Insufficient documentation

## 2021-03-08 ENCOUNTER — Encounter: Payer: Self-pay | Admitting: *Deleted

## 2021-03-09 DIAGNOSIS — Z170 Estrogen receptor positive status [ER+]: Secondary | ICD-10-CM | POA: Diagnosis not present

## 2021-03-09 DIAGNOSIS — C50411 Malignant neoplasm of upper-outer quadrant of right female breast: Secondary | ICD-10-CM | POA: Diagnosis not present

## 2021-03-09 DIAGNOSIS — Z510 Encounter for antineoplastic radiation therapy: Secondary | ICD-10-CM | POA: Diagnosis not present

## 2021-03-11 ENCOUNTER — Ambulatory Visit
Admission: RE | Admit: 2021-03-11 | Discharge: 2021-03-11 | Disposition: A | Discharge status: Home / Self Care | Payer: Medicare Other | Source: Ambulatory Visit | Attending: Radiation Oncology | Admitting: Radiation Oncology

## 2021-03-11 ENCOUNTER — Other Ambulatory Visit: Payer: Self-pay

## 2021-03-11 DIAGNOSIS — C50411 Malignant neoplasm of upper-outer quadrant of right female breast: Secondary | ICD-10-CM | POA: Diagnosis not present

## 2021-03-11 DIAGNOSIS — Z170 Estrogen receptor positive status [ER+]: Secondary | ICD-10-CM | POA: Diagnosis not present

## 2021-03-11 DIAGNOSIS — Z510 Encounter for antineoplastic radiation therapy: Secondary | ICD-10-CM | POA: Diagnosis not present

## 2021-03-12 ENCOUNTER — Ambulatory Visit
Admission: RE | Admit: 2021-03-12 | Discharge: 2021-03-12 | Disposition: A | Discharge status: Home / Self Care | Payer: Medicare Other | Source: Ambulatory Visit | Attending: Radiation Oncology | Admitting: Radiation Oncology

## 2021-03-12 DIAGNOSIS — Z170 Estrogen receptor positive status [ER+]: Secondary | ICD-10-CM | POA: Diagnosis not present

## 2021-03-12 DIAGNOSIS — C50411 Malignant neoplasm of upper-outer quadrant of right female breast: Secondary | ICD-10-CM | POA: Diagnosis not present

## 2021-03-12 DIAGNOSIS — N6320 Unspecified lump in the left breast, unspecified quadrant: Secondary | ICD-10-CM

## 2021-03-12 DIAGNOSIS — Z510 Encounter for antineoplastic radiation therapy: Secondary | ICD-10-CM | POA: Diagnosis not present

## 2021-03-12 MED ORDER — RADIAPLEXRX EX GEL: Freq: Once | CUTANEOUS | Status: AC

## 2021-03-12 MED ORDER — ALRA NON-METALLIC DEODORANT (RAD-ONC)
1.0000 "application " | Freq: Once | TOPICAL | Status: AC
  Administered 2021-03-12: 1 via TOPICAL

## 2021-03-12 NOTE — Progress Notes (Signed)

## 2021-03-15 ENCOUNTER — Ambulatory Visit
Admission: RE | Admit: 2021-03-15 | Discharge: 2021-03-15 | Disposition: A | Discharge status: Home / Self Care | Payer: Medicare Other | Source: Ambulatory Visit | Attending: Radiation Oncology | Admitting: Radiation Oncology

## 2021-03-15 DIAGNOSIS — Z170 Estrogen receptor positive status [ER+]: Secondary | ICD-10-CM | POA: Insufficient documentation

## 2021-03-15 DIAGNOSIS — C50411 Malignant neoplasm of upper-outer quadrant of right female breast: Secondary | ICD-10-CM | POA: Diagnosis not present

## 2021-03-15 DIAGNOSIS — Z510 Encounter for antineoplastic radiation therapy: Secondary | ICD-10-CM | POA: Diagnosis not present

## 2021-03-16 ENCOUNTER — Ambulatory Visit
Admission: RE | Admit: 2021-03-16 | Discharge: 2021-03-16 | Disposition: A | Discharge status: Home / Self Care | Payer: Medicare Other | Source: Ambulatory Visit | Attending: Radiation Oncology | Admitting: Radiation Oncology

## 2021-03-16 DIAGNOSIS — Z510 Encounter for antineoplastic radiation therapy: Secondary | ICD-10-CM | POA: Diagnosis not present

## 2021-03-16 DIAGNOSIS — Z170 Estrogen receptor positive status [ER+]: Secondary | ICD-10-CM | POA: Diagnosis not present

## 2021-03-16 DIAGNOSIS — C50411 Malignant neoplasm of upper-outer quadrant of right female breast: Secondary | ICD-10-CM | POA: Diagnosis not present

## 2021-03-17 ENCOUNTER — Other Ambulatory Visit: Payer: Self-pay

## 2021-03-17 ENCOUNTER — Ambulatory Visit
Admission: RE | Admit: 2021-03-17 | Discharge: 2021-03-17 | Disposition: A | Discharge status: Home / Self Care | Payer: Medicare Other | Source: Ambulatory Visit | Attending: Radiation Oncology | Admitting: Radiation Oncology

## 2021-03-17 DIAGNOSIS — C50411 Malignant neoplasm of upper-outer quadrant of right female breast: Secondary | ICD-10-CM | POA: Diagnosis not present

## 2021-03-17 DIAGNOSIS — Z170 Estrogen receptor positive status [ER+]: Secondary | ICD-10-CM | POA: Diagnosis not present

## 2021-03-17 DIAGNOSIS — Z510 Encounter for antineoplastic radiation therapy: Secondary | ICD-10-CM | POA: Diagnosis not present

## 2021-03-18 ENCOUNTER — Ambulatory Visit
Admission: RE | Admit: 2021-03-18 | Discharge: 2021-03-18 | Disposition: A | Discharge status: Home / Self Care | Payer: Medicare Other | Source: Ambulatory Visit | Attending: Radiation Oncology | Admitting: Radiation Oncology

## 2021-03-18 DIAGNOSIS — C50411 Malignant neoplasm of upper-outer quadrant of right female breast: Secondary | ICD-10-CM | POA: Diagnosis not present

## 2021-03-18 DIAGNOSIS — Z170 Estrogen receptor positive status [ER+]: Secondary | ICD-10-CM | POA: Diagnosis not present

## 2021-03-18 DIAGNOSIS — Z510 Encounter for antineoplastic radiation therapy: Secondary | ICD-10-CM | POA: Diagnosis not present

## 2021-03-19 ENCOUNTER — Other Ambulatory Visit: Payer: Self-pay

## 2021-03-19 ENCOUNTER — Ambulatory Visit
Admission: RE | Admit: 2021-03-19 | Discharge: 2021-03-19 | Disposition: A | Discharge status: Home / Self Care | Payer: Medicare Other | Source: Ambulatory Visit | Attending: Radiation Oncology | Admitting: Radiation Oncology

## 2021-03-19 DIAGNOSIS — C50411 Malignant neoplasm of upper-outer quadrant of right female breast: Secondary | ICD-10-CM | POA: Diagnosis not present

## 2021-03-19 DIAGNOSIS — Z170 Estrogen receptor positive status [ER+]: Secondary | ICD-10-CM | POA: Diagnosis not present

## 2021-03-19 DIAGNOSIS — Z510 Encounter for antineoplastic radiation therapy: Secondary | ICD-10-CM | POA: Diagnosis not present

## 2021-03-22 ENCOUNTER — Other Ambulatory Visit: Payer: Self-pay

## 2021-03-22 ENCOUNTER — Ambulatory Visit
Admission: RE | Admit: 2021-03-22 | Discharge: 2021-03-22 | Disposition: A | Discharge status: Home / Self Care | Payer: Medicare Other | Source: Ambulatory Visit | Attending: Radiation Oncology | Admitting: Radiation Oncology

## 2021-03-22 DIAGNOSIS — Z510 Encounter for antineoplastic radiation therapy: Secondary | ICD-10-CM | POA: Diagnosis not present

## 2021-03-22 DIAGNOSIS — Z170 Estrogen receptor positive status [ER+]: Secondary | ICD-10-CM | POA: Diagnosis not present

## 2021-03-22 DIAGNOSIS — C50411 Malignant neoplasm of upper-outer quadrant of right female breast: Secondary | ICD-10-CM | POA: Diagnosis not present

## 2021-03-23 ENCOUNTER — Ambulatory Visit
Admission: RE | Admit: 2021-03-23 | Discharge: 2021-03-23 | Disposition: A | Discharge status: Home / Self Care | Payer: Medicare Other | Source: Ambulatory Visit | Attending: Radiation Oncology | Admitting: Radiation Oncology

## 2021-03-23 DIAGNOSIS — Z170 Estrogen receptor positive status [ER+]: Secondary | ICD-10-CM | POA: Diagnosis not present

## 2021-03-23 DIAGNOSIS — Z510 Encounter for antineoplastic radiation therapy: Secondary | ICD-10-CM | POA: Diagnosis not present

## 2021-03-23 DIAGNOSIS — C50411 Malignant neoplasm of upper-outer quadrant of right female breast: Secondary | ICD-10-CM | POA: Diagnosis not present

## 2021-03-24 ENCOUNTER — Ambulatory Visit
Admission: RE | Admit: 2021-03-24 | Discharge: 2021-03-24 | Disposition: A | Discharge status: Home / Self Care | Payer: Medicare Other | Source: Ambulatory Visit | Attending: Radiation Oncology | Admitting: Radiation Oncology

## 2021-03-24 ENCOUNTER — Other Ambulatory Visit: Payer: Self-pay

## 2021-03-24 DIAGNOSIS — Z510 Encounter for antineoplastic radiation therapy: Secondary | ICD-10-CM | POA: Diagnosis not present

## 2021-03-24 DIAGNOSIS — C50411 Malignant neoplasm of upper-outer quadrant of right female breast: Secondary | ICD-10-CM | POA: Diagnosis not present

## 2021-03-24 DIAGNOSIS — Z170 Estrogen receptor positive status [ER+]: Secondary | ICD-10-CM | POA: Diagnosis not present

## 2021-03-25 ENCOUNTER — Ambulatory Visit
Admission: RE | Admit: 2021-03-25 | Discharge: 2021-03-25 | Disposition: A | Discharge status: Home / Self Care | Payer: Medicare Other | Source: Ambulatory Visit | Attending: Radiation Oncology | Admitting: Radiation Oncology

## 2021-03-25 DIAGNOSIS — Z170 Estrogen receptor positive status [ER+]: Secondary | ICD-10-CM | POA: Diagnosis not present

## 2021-03-25 DIAGNOSIS — Z510 Encounter for antineoplastic radiation therapy: Secondary | ICD-10-CM | POA: Diagnosis not present

## 2021-03-25 DIAGNOSIS — C50411 Malignant neoplasm of upper-outer quadrant of right female breast: Secondary | ICD-10-CM | POA: Diagnosis not present

## 2021-03-26 ENCOUNTER — Other Ambulatory Visit: Payer: Self-pay

## 2021-03-26 ENCOUNTER — Ambulatory Visit: Payer: Medicare Other | Admitting: Radiation Oncology

## 2021-03-26 ENCOUNTER — Ambulatory Visit
Admission: RE | Admit: 2021-03-26 | Discharge: 2021-03-26 | Disposition: A | Discharge status: Home / Self Care | Payer: Medicare Other | Source: Ambulatory Visit | Attending: Radiation Oncology | Admitting: Radiation Oncology

## 2021-03-26 DIAGNOSIS — Z510 Encounter for antineoplastic radiation therapy: Secondary | ICD-10-CM | POA: Diagnosis not present

## 2021-03-26 DIAGNOSIS — Z170 Estrogen receptor positive status [ER+]: Secondary | ICD-10-CM | POA: Diagnosis not present

## 2021-03-26 DIAGNOSIS — C50411 Malignant neoplasm of upper-outer quadrant of right female breast: Secondary | ICD-10-CM | POA: Diagnosis not present

## 2021-03-29 ENCOUNTER — Ambulatory Visit
Admission: RE | Admit: 2021-03-29 | Discharge: 2021-03-29 | Disposition: A | Discharge status: Home / Self Care | Payer: Medicare Other | Source: Ambulatory Visit | Attending: Radiation Oncology | Admitting: Radiation Oncology

## 2021-03-29 DIAGNOSIS — Z510 Encounter for antineoplastic radiation therapy: Secondary | ICD-10-CM | POA: Diagnosis not present

## 2021-03-29 DIAGNOSIS — Z170 Estrogen receptor positive status [ER+]: Secondary | ICD-10-CM | POA: Diagnosis not present

## 2021-03-29 DIAGNOSIS — C50411 Malignant neoplasm of upper-outer quadrant of right female breast: Secondary | ICD-10-CM | POA: Diagnosis not present

## 2021-03-30 ENCOUNTER — Ambulatory Visit
Admission: RE | Admit: 2021-03-30 | Discharge: 2021-03-30 | Disposition: A | Discharge status: Home / Self Care | Payer: Medicare Other | Source: Ambulatory Visit | Attending: Radiation Oncology | Admitting: Radiation Oncology

## 2021-03-30 ENCOUNTER — Other Ambulatory Visit: Payer: Self-pay

## 2021-03-30 ENCOUNTER — Ambulatory Visit: Payer: Medicare Other | Admitting: Radiation Oncology

## 2021-03-30 DIAGNOSIS — Z510 Encounter for antineoplastic radiation therapy: Secondary | ICD-10-CM | POA: Diagnosis not present

## 2021-03-30 DIAGNOSIS — C50411 Malignant neoplasm of upper-outer quadrant of right female breast: Secondary | ICD-10-CM | POA: Diagnosis not present

## 2021-03-30 DIAGNOSIS — Z170 Estrogen receptor positive status [ER+]: Secondary | ICD-10-CM | POA: Diagnosis not present

## 2021-03-31 ENCOUNTER — Ambulatory Visit
Admission: RE | Admit: 2021-03-31 | Discharge: 2021-03-31 | Disposition: A | Discharge status: Home / Self Care | Payer: Medicare Other | Source: Ambulatory Visit | Attending: Radiation Oncology | Admitting: Radiation Oncology

## 2021-03-31 DIAGNOSIS — Z170 Estrogen receptor positive status [ER+]: Secondary | ICD-10-CM | POA: Diagnosis not present

## 2021-03-31 DIAGNOSIS — Z510 Encounter for antineoplastic radiation therapy: Secondary | ICD-10-CM | POA: Diagnosis not present

## 2021-03-31 DIAGNOSIS — C50411 Malignant neoplasm of upper-outer quadrant of right female breast: Secondary | ICD-10-CM | POA: Diagnosis not present

## 2021-04-01 ENCOUNTER — Ambulatory Visit
Admission: RE | Admit: 2021-04-01 | Discharge: 2021-04-01 | Disposition: A | Discharge status: Home / Self Care | Payer: Medicare Other | Source: Ambulatory Visit | Attending: Radiation Oncology | Admitting: Radiation Oncology

## 2021-04-01 ENCOUNTER — Other Ambulatory Visit: Payer: Self-pay

## 2021-04-01 DIAGNOSIS — C50411 Malignant neoplasm of upper-outer quadrant of right female breast: Secondary | ICD-10-CM | POA: Diagnosis not present

## 2021-04-01 DIAGNOSIS — Z510 Encounter for antineoplastic radiation therapy: Secondary | ICD-10-CM | POA: Diagnosis not present

## 2021-04-01 DIAGNOSIS — Z170 Estrogen receptor positive status [ER+]: Secondary | ICD-10-CM | POA: Diagnosis not present

## 2021-04-02 ENCOUNTER — Ambulatory Visit
Admission: RE | Admit: 2021-04-02 | Discharge: 2021-04-02 | Disposition: A | Discharge status: Home / Self Care | Payer: Medicare Other | Source: Ambulatory Visit | Attending: Radiation Oncology | Admitting: Radiation Oncology

## 2021-04-02 DIAGNOSIS — Z510 Encounter for antineoplastic radiation therapy: Secondary | ICD-10-CM | POA: Diagnosis not present

## 2021-04-02 DIAGNOSIS — Z170 Estrogen receptor positive status [ER+]: Secondary | ICD-10-CM | POA: Diagnosis not present

## 2021-04-02 DIAGNOSIS — C50411 Malignant neoplasm of upper-outer quadrant of right female breast: Secondary | ICD-10-CM | POA: Diagnosis not present

## 2021-04-05 ENCOUNTER — Ambulatory Visit
Admission: RE | Admit: 2021-04-05 | Discharge: 2021-04-05 | Disposition: A | Discharge status: Home / Self Care | Payer: Medicare Other | Source: Ambulatory Visit | Attending: Radiation Oncology | Admitting: Radiation Oncology

## 2021-04-05 ENCOUNTER — Other Ambulatory Visit: Payer: Self-pay

## 2021-04-05 DIAGNOSIS — Z510 Encounter for antineoplastic radiation therapy: Secondary | ICD-10-CM | POA: Diagnosis not present

## 2021-04-05 DIAGNOSIS — Z170 Estrogen receptor positive status [ER+]: Secondary | ICD-10-CM | POA: Diagnosis not present

## 2021-04-05 DIAGNOSIS — C50411 Malignant neoplasm of upper-outer quadrant of right female breast: Secondary | ICD-10-CM | POA: Diagnosis not present

## 2021-04-06 ENCOUNTER — Ambulatory Visit
Admission: RE | Admit: 2021-04-06 | Discharge: 2021-04-06 | Disposition: A | Discharge status: Home / Self Care | Payer: Medicare Other | Source: Ambulatory Visit | Attending: Radiation Oncology | Admitting: Radiation Oncology

## 2021-04-06 ENCOUNTER — Encounter: Payer: Self-pay | Admitting: *Deleted

## 2021-04-06 DIAGNOSIS — Z170 Estrogen receptor positive status [ER+]: Secondary | ICD-10-CM | POA: Diagnosis not present

## 2021-04-06 DIAGNOSIS — C50411 Malignant neoplasm of upper-outer quadrant of right female breast: Secondary | ICD-10-CM | POA: Diagnosis not present

## 2021-04-06 DIAGNOSIS — Z510 Encounter for antineoplastic radiation therapy: Secondary | ICD-10-CM | POA: Diagnosis not present

## 2021-04-07 ENCOUNTER — Other Ambulatory Visit: Payer: Self-pay

## 2021-04-07 ENCOUNTER — Encounter: Payer: Self-pay | Admitting: Radiation Oncology

## 2021-04-07 ENCOUNTER — Ambulatory Visit
Admission: RE | Admit: 2021-04-07 | Discharge: 2021-04-07 | Disposition: A | Discharge status: Home / Self Care | Payer: Medicare Other | Source: Ambulatory Visit | Attending: Radiation Oncology | Admitting: Radiation Oncology

## 2021-04-07 DIAGNOSIS — C50411 Malignant neoplasm of upper-outer quadrant of right female breast: Secondary | ICD-10-CM | POA: Diagnosis not present

## 2021-04-07 DIAGNOSIS — Z170 Estrogen receptor positive status [ER+]: Secondary | ICD-10-CM | POA: Diagnosis not present

## 2021-04-07 DIAGNOSIS — Z510 Encounter for antineoplastic radiation therapy: Secondary | ICD-10-CM | POA: Diagnosis not present

## 2021-04-19 NOTE — Progress Notes (Signed)
  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  Intent: Curative  Radiation Treatment Dates: 03/11/2021 through 04/07/2021 Site Technique Total Dose (Gy) Dose per Fx (Gy) Completed Fx Beam Energies  Breast, Right: Breast_Rt 3D 42.56/42.56 2.66 16/16 6X  Breast, Right: Breast_Rt_Bst specialPort 8/8 2 4/4 15E   Narrative: The patient tolerated radiation therapy relatively well. She did develop hyperpigmentation of her skin in the treatment field but no tenderness or itching or fatigue was noted at the completion of treatment.  Plan: The patient will receive a call in about one month from the radiation oncology department. She will continue follow up with Dr. Jana Hakim as well.   ________________________________________________    Carola Rhine, [facility]

## 2021-05-02 NOTE — Progress Notes (Signed)
[facility]  Telephone:([phone] Fax:(336) [phone]     ID: [name] DOB: 1941-05-23  MR#: [id number]  XOV#:[id number]  Patient Care Team: Celene Squibb, MD as PCP - General (Internal Medicine) Estill Dooms, NP as Nurse Practitioner (Obstetrics and Gynecology) Jovita Kussmaul, MD as Consulting Physician (General Surgery) Adelle Zachar, Virgie Dad, MD as Consulting Physician (Oncology) Rogene Houston, MD as Consulting Physician (Gastroenterology) Register, Luetta Nutting, PA-C as Physician Assistant (Dermatology) Kyung Rudd, MD as Consulting Physician (Radiation Oncology) Chauncey Cruel, MD OTHER MD:  CHIEF COMPLAINT: Estrogen receptor positive breast cancer  CURRENT TREATMENT:    INTERVAL HISTORY: [name] returns today for follow up of her estrogen receptor positive breast cancer. She was evaluated in the breast cancer clinic on 01/19/2021.  She is accompanied by her husband  She proceeded to right lumpectomy on 01/29/2021 under Dr. Marlou Starks. Pathology from the procedure [id number]) showed: invasive ductal carcinoma with extracellular mucin, 1 cm, grade 2; ductal carcinoma in situ, intermediate to high grade; resection margins negative.  Both biopsied lymph nodes were negative for carcinoma.  She met with our genetic counselor on 02/08/2021. She ultimately declined to proceed with genetic testing.  She was referred back to Dr. Lisbeth Renshaw on 03/03/2021 to review radiation therapy. She subsequently received treatment from 03/11/2021 through 04/07/2021.   REVIEW OF SYSTEMS: [name] tolerated her surgery very well and then did well with her radiation.  She had mild erythema but no desquamation.  She tells me she is normally active, just returned from a trip to [place], does some gardening, and they "eat out a lot" because she does not like to cook.  The are doing their best to eat a lot of vegetables.  A detailed review of systems today was otherwise stable   COVID 19  VACCINATION STATUS: fully vaccinated Levan Hurst), with booster 07/2020   HISTORY OF CURRENT ILLNESS: From the original intake note:  [place] had routine screening mammography on 05/11/2020 showing a possible abnormality in the right breast. She underwent right diagnostic mammography with tomography and right breast ultrasonography at [facility] on 05/15/2020 showing: breast density category B; probable benign 1.1 cm mass in right breast at 9 o'clock. A six-month follow up right breast ultrasound was recommended.  Repeat right breast ultrasound was performed on 01/01/2021 showing: mild interval increase in size and irregularity of previously demonstrated mass in right breast at 9 o'clock, now 1.3 cm; normal-appearing right axillary lymph nodes.  Accordingly on the same day, she proceeded to biopsy of the right breast area in question. The pathology from this procedure ([id number]) showed: ductal carcinoma, with differential including papillary carcinoma. Prognostic indicators significant for: estrogen receptor, 95% positive and progesterone receptor, 5% positive, both with strong staining intensity. Proliferation marker Ki67 at 15%. HER2 equivocal by immunohistochemistry (2+), but negative by fluorescent in situ hybridization with a signals ratio 1.38 and number per cell 2.  Cancer Staging Malignant neoplasm of upper-outer quadrant of right breast in female, estrogen receptor positive ([place]) Staging form: Breast, AJCC 8th Edition - Clinical: cT1c, cN0, cM0, GX, ER+, PR+, HER2- - Signed by Chauncey Cruel, MD on 01/19/2021 - Pathologic stage from 01/29/2021: Stage IA (pT1b, pN0, cM0, G2, ER+, PR+, HER2-) - Signed by Gardenia Phlegm, NP on 02/17/2021 Stage prefix: Initial diagnosis Histologic grading system: 3 grade system  The patient's subsequent history is as detailed below.   PAST MEDICAL HISTORY: Past Medical History:  Diagnosis Date   Anxiety  Breast cancer ([place])  01/05/2021   12/2020 Ductal carcinoma right breast    Breast mass, left 08/24/2015   Family history of breast cancer    Family history of pancreatic cancer    Hypertension    Positive fecal occult blood test 02/20/2014   Will send 3 cards home    PAST SURGICAL HISTORY: Past Surgical History:  Procedure Laterality Date   BREAST CYST EXCISION     BREAST EXCISIONAL BIOPSY Bilateral >10+ years ago   benign   BREAST LUMPECTOMY WITH RADIOACTIVE SEED AND SENTINEL LYMPH NODE BIOPSY Right 01/29/2021   Procedure: RIGHT BREAST LUMPECTOMY WITH RADIOACTIVE SEED AND SENTINEL LYMPH NODE BIOPSY;  Surgeon: Jovita Kussmaul, MD;  Location: [facility];  Service: General;  Laterality: Right;  START TIME OF 7:30 AM FOR 90 MINUTES ROOM 1   BREAST SURGERY Left    partail mastectomy-benign   CATARACT EXTRACTION, BILATERAL     [place]   CHOLECYSTECTOMY N/A 11/29/2013   Procedure: LAPAROSCOPIC CHOLECYSTECTOMY;  Surgeon: Scherry Ran, MD;  Location: [facility];  Service: General;  Laterality: N/A;   COLONOSCOPY N/A 03/14/2014   Procedure: COLONOSCOPY;  Surgeon: Rogene Houston, MD;  [facility];  Service: Endoscopy;  Laterality: N/A;  1250   COLONOSCOPY N/A 12/20/2019   Procedure: COLONOSCOPY;  Surgeon: Rogene Houston, MD;  [facility];  Service: Endoscopy;  Laterality: N/A;  1020   EYE SURGERY     Bilateral cataracts   MASTECTOMY     Partial left breast   POLYPECTOMY  12/20/2019   Procedure: POLYPECTOMY;  Surgeon: Rogene Houston, MD;  [facility];  Service: Endoscopy;;   TONSILLECTOMY      FAMILY HISTORY: Family History  Problem Relation Age of Onset   Cancer Mother        lung   Cancer Father        lung   Hypertension Father    Heart disease Maternal Grandfather    Heart attack Maternal Grandfather    Heart disease Paternal Grandfather    Heart attack Paternal Grandfather    Cancer Other        breast dx 21   Arthritis Sister        rheumatoid   COPD Sister     Breast cancer Sister 79   Breast cancer Cousin        dx 60s   Pancreatic cancer Cousin        d. 35s  The patient's father died at age 64 from lung cancer in the setting o of tobacco abuse.  The patient's mother died at age 79 from the same.  The patient's mother had a niece with breast cancer in her 97s.  The patient herself had 2 sisters and 1 brother, and one of her sisters had breast cancer.  The patient herself has a niece (daughter from her sister who does not have breast cancer) with breast cancer diagnosed at age 38.  The patient also has 1 brother with no history of cancer   GYNECOLOGIC HISTORY:  No LMP recorded. Patient is postmenopausal. Menarche: 80 years old GX P 0 LMP age 18 Contraceptive HRT no Hysterectomy? no BSO? 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  Durene Cal (adopted) children are Yong Channel lives near [place] and sells surgical equipment and Gilmore Laroche who is  a Pharmacist, hospital in [place].  The patient has 5 grandchildren.  She is a Tourist information centre manager.    ADVANCED DIRECTIVES: In the absence of any documentation to the contrary, the patient's spouse is their HCPOA.    HEALTH MAINTENANCE: Social History   Tobacco Use   Smoking status: Former    Packs/day: 0.50    Years: 4.00    Pack years: 2.00    Types: Cigarettes    Quit date: 11/29/1967    Years since quitting: 53.4   Smokeless tobacco: Never  Vaping Use   Vaping Use: Never used  Substance Use Topics   Alcohol use: Yes    Alcohol/week: 5.0 standard drinks    Types: 5 Glasses of wine per week    Comment: social   Drug use: No     Colonoscopy: 12/2019 (Dr. Laural Golden), recall 2026  PAP: 02/2014, negative; repeat not indicated  Bone density: 2010?   No Known Allergies  Current Outpatient Medications  Medication Sig Dispense Refill   Ascorbic Acid (VITAMIN C GUMMIES PO) Take 250 mg by  mouth daily.     busPIRone (BUSPAR) 7.5 MG tablet Take 1 tablet (7.5 mg total) by mouth 3 (three) times daily. (Patient taking differently: Take 7.5 mg by mouth 2 (two) times daily.) 90 tablet 3   Cholecalciferol (VITAMIN D3 GUMMIES ADULT PO) Take 2,000 Units by mouth daily.     HYDROcodone-acetaminophen (NORCO/VICODIN) 5-325 MG tablet Take 1-2 tablets by mouth every 6 (six) hours as needed for moderate pain or severe pain. 10 tablet 0   hydrOXYzine (ATARAX/VISTARIL) 10 MG tablet Take 1 tablet (10 mg total) by mouth 3 (three) times daily as needed. For increased anxiety (Patient not taking: No sig reported) 30 tablet 0   losartan (COZAAR) 25 MG tablet Take 25 mg by mouth daily.     tamoxifen (NOLVADEX) 20 MG tablet Take 20 mg by mouth daily. (Patient not taking: No sig reported)     No current facility-administered medications for this visit.    OBJECTIVE: White woman who appears younger than stated age  23:   05/03/21 1507  BP: (!) 199/92  Pulse: 74  Resp: 17  Temp: 97.9 F (36.6 C)  SpO2: 100%      Body mass index is 30.73 kg/m.   Wt Readings from Last 3 Encounters:  05/03/21 173 lb 8 oz (78.7 kg)  03/03/21 170 lb 8 oz (77.3 kg)  01/29/21 170 lb (77.1 kg)      ECOG FS:1 - Symptomatic but completely ambulatory  Sclerae unicteric, EOMs intact Wearing a mask No cervical or supraclavicular adenopathy Lungs no rales or rhonchi Heart regular rate and rhythm Abd soft, nontender, positive bowel sounds MSK no focal spinal tenderness, no upper extremity lymphedema Neuro: nonfocal, well oriented, appropriate affect Breasts: The right breast is status post biopsy and radiation.  The cosmetic result is excellent.  There is no evidence of residual recurrent disease.  Left breast is benign.  Both axillae are benign.   LAB RESULTS:  CMP     Component Value Date/Time   NA 145 01/19/2021 1514   K 3.7 01/19/2021 1514   CL 111 01/19/2021 1514   CO2 25 01/19/2021 1514   GLUCOSE  113 (H) 01/19/2021 1514   BUN 14 01/19/2021 1514   CREATININE 0.84 01/19/2021 1514   CREATININE 0.63 02/20/2014 1011   CALCIUM 9.5 01/19/2021 1514   PROT 7.0 01/19/2021 1514   ALBUMIN 4.2 01/19/2021 1514   AST 11 (L) 01/19/2021 1514  ALT 16 01/19/2021 1514   ALKPHOS 70 01/19/2021 1514   BILITOT 0.6 01/19/2021 1514   GFRNONAA >60 01/19/2021 1514   GFRAA >90 11/30/2013 0634    No results found for: TOTALPROTELP, ALBUMINELP, A1GS, A2GS, BETS, BETA2SER, GAMS, MSPIKE, SPEI  Lab Results  Component Value Date   WBC 8.0 01/19/2021   NEUTROABS 4.9 01/19/2021   HGB 14.3 01/19/2021   HCT 43.4 01/19/2021   MCV 92.9 01/19/2021   PLT 243 01/19/2021    No results found for: LABCA2  No components found for: OIBBCW888  No results for input(s): INR in the last 168 hours.  No results found for: LABCA2  No results found for: BVQ945  No results found for: WTU882  No results found for: CMK349  No results found for: CA2729  No components found for: HGQUANT  No results found for: CEA1 / No results found for: CEA1   No results found for: AFPTUMOR  No results found for: CHROMOGRNA  No results found for: KPAFRELGTCHN, LAMBDASER, KAPLAMBRATIO (kappa/lambda light chains)  No results found for: HGBA, HGBA2QUANT, HGBFQUANT, HGBSQUAN (Hemoglobinopathy evaluation)   No results found for: LDH  No results found for: IRON, TIBC, IRONPCTSAT (Iron and TIBC)  No results found for: FERRITIN  Urinalysis No results found for: COLORURINE, APPEARANCEUR, LABSPEC, PHURINE, GLUCOSEU, HGBUR, BILIRUBINUR, KETONESUR, PROTEINUR, UROBILINOGEN, NITRITE, LEUKOCYTESUR   STUDIES: No results found.   ELIGIBLE FOR AVAILABLE RESEARCH PROTOCOL: no  ASSESSMENT: 80 y.o. [place] woman status post right breast upper outer quadrant biopsy 01/01/2021 for a clinical T1c N0 (invasive) ductal carcinoma, grade not stated, estrogen and progesterone receptor positive, HER-2 not amplified, with an MIB-1 of  15%  (1) s/p right lumpectomy and sentinel node sampling 01/29/2021 for a pT1b pN0, stage IA mucinous invasive ductal carcinoma, grade 2, with negative margins  (2) adjuvant radiation4/28/2022 through 04/07/2021 Site Technique Total Dose (Gy) Dose per Fx (Gy) Completed Fx Beam Energies  Breast, Right: Breast_Rt 3D 42.56/42.56 2.66 16/16 6X  Breast, Right: Breast_Rt_Bst specialPort 8/8 2 4/4 15E     (3) patient decided against genetics testing  (4) anastrozole started 05/04/2019   PLAN:  Karryn has completed her local treatment and is ready to start systemic therapy which will consist of antiestrogens.  We discussed the difference between tamoxifen and anastrozole in detail. She understands that anastrozole and the aromatase inhibitors in general work by blocking estrogen production. Accordingly vaginal dryness, decrease in bone density, and of course hot flashes can result. The aromatase inhibitors can also negatively affect the cholesterol profile, although that is a minor effect. One out of 5 women on aromatase inhibitors we will feel "old and achy". This arthralgia/myalgia syndrome, which resembles fibromyalgia clinically, does resolve with stopping the medications. Accordingly this is not a reason to not try an aromatase inhibitor but it is a frequent reason to stop it (in other words 20% of women will not be able to tolerate these medications).  Tamoxifen on the other hand does not block estrogen production. It does not "take away a woman's estrogen". It blocks the estrogen receptor in breast cells. Like anastrozole, it can also cause hot flashes. As opposed to anastrozole, tamoxifen has many estrogen-like effects. It is technically an estrogen receptor modulator. This means that in some tissues tamoxifen works like estrogen-- for example it helps strengthen the bones. It tends to improve the cholesterol profile. It can cause thickening of the endometrial lining, and even endometrial polyps  or rarely cancer of the uterus.(The risk of uterine cancer  due to tamoxifen is one additional cancer per thousand women year). It can cause vaginal wetness or stickiness. It can cause blood clots through this estrogen-like effect--the risk of blood clots with tamoxifen is exactly the same as with birth control pills or hormone replacement.  Neither of these agents causes mood changes or weight gain, despite the popular belief that they can have these side effects. We have data from studies comparing either of these drugs with placebo, and in those cases the control group had the same amount of weight gain and depression as the group that took the drug.  After this discussion, although originally we had thought she might go for tamoxifen, we are going to go with anastrozole.  I placed the prescription in.  She will let me know if she has any unusual side effects or complications.  She will be due for repeat mammography in October and we will do a bone density at the same time.  She will then return to see me in November.  She knows to call for any other issue that may develop before that visit  Total encounter time 35 minutes.Sarajane Jews C. Lene Mckay, MD 05/03/2021 3:11 PM Medical Oncology and Hematology [facility] [place], [place] [address] Tel. [phone]    Fax. [phone]   This document serves as a record of services personally performed by Lurline Del, MD. It was created on his behalf by Wilburn Mylar, a trained medical scribe. The creation of this record is based on the scribe's personal observations and the provider's statements to them.   I, Lurline Del MD, have reviewed the above documentation for accuracy and completeness, and I agree with the above.   *Total Encounter Time as defined by the Centers for Medicare and Medicaid Services includes, in addition to the face-to-face time of a patient visit (documented in the note above)  non-face-to-face time: obtaining and reviewing outside history, ordering and reviewing medications, tests or procedures, care coordination (communications with other health care professionals or caregivers) and documentation in the medical record.

## 2021-05-03 ENCOUNTER — Other Ambulatory Visit: Payer: Self-pay

## 2021-05-03 ENCOUNTER — Inpatient Hospital Stay: Payer: Medicare Other | Attending: Oncology | Admitting: Oncology

## 2021-05-03 VITALS — BP 199/92 | HR 74 | Temp 97.9°F | Resp 17 | Wt 173.5 lb

## 2021-05-03 DIAGNOSIS — Z79899 Other long term (current) drug therapy: Secondary | ICD-10-CM | POA: Diagnosis not present

## 2021-05-03 DIAGNOSIS — Z801 Family history of malignant neoplasm of trachea, bronchus and lung: Secondary | ICD-10-CM | POA: Insufficient documentation

## 2021-05-03 DIAGNOSIS — Z170 Estrogen receptor positive status [ER+]: Secondary | ICD-10-CM | POA: Diagnosis not present

## 2021-05-03 DIAGNOSIS — C50411 Malignant neoplasm of upper-outer quadrant of right female breast: Secondary | ICD-10-CM | POA: Diagnosis not present

## 2021-05-03 DIAGNOSIS — Z79811 Long term (current) use of aromatase inhibitors: Secondary | ICD-10-CM | POA: Insufficient documentation

## 2021-05-03 DIAGNOSIS — I10 Essential (primary) hypertension: Secondary | ICD-10-CM | POA: Diagnosis not present

## 2021-05-03 DIAGNOSIS — Z800 Family history of malignant neoplasm of digestive organs: Secondary | ICD-10-CM | POA: Insufficient documentation

## 2021-05-03 DIAGNOSIS — Z8249 Family history of ischemic heart disease and other diseases of the circulatory system: Secondary | ICD-10-CM | POA: Diagnosis not present

## 2021-05-03 DIAGNOSIS — M8588 Other specified disorders of bone density and structure, other site: Secondary | ICD-10-CM | POA: Diagnosis not present

## 2021-05-03 DIAGNOSIS — Z803 Family history of malignant neoplasm of breast: Secondary | ICD-10-CM | POA: Insufficient documentation

## 2021-05-03 DIAGNOSIS — M8580 Other specified disorders of bone density and structure, unspecified site: Secondary | ICD-10-CM | POA: Insufficient documentation

## 2021-05-03 MED ORDER — ANASTROZOLE 1 MG PO TABS: 1.0000 mg | ORAL_TABLET | Freq: Every day | ORAL | 4 refills | Status: DC

## 2021-05-04 ENCOUNTER — Telehealth: Payer: Self-pay | Admitting: Oncology

## 2021-05-04 NOTE — Telephone Encounter (Signed)
Scheduled appointment per 06/20 los. Patient is aware.

## 2021-05-31 ENCOUNTER — Other Ambulatory Visit: Payer: Self-pay

## 2021-05-31 ENCOUNTER — Ambulatory Visit
Admission: RE | Admit: 2021-05-31 | Discharge: 2021-05-31 | Disposition: A | Discharge status: Home / Self Care | Payer: Medicare Other | Source: Ambulatory Visit | Attending: Oncology | Admitting: Oncology

## 2021-05-31 DIAGNOSIS — C50411 Malignant neoplasm of upper-outer quadrant of right female breast: Secondary | ICD-10-CM

## 2021-05-31 NOTE — Progress Notes (Signed)
  Radiation Oncology         [phone] ________________________________  Name: [name] MRN: [id number]  Date of Service: 05/31/2021  DOB: 13-Oct-1941  Post Treatment Telephone Note  Diagnosis:   Stage IA, pT1bN0M0 ER/PR positive invasive ductal carcinoma of the right breast.  Interval Since Last Radiation:  8 weeks   03/11/2021 through 04/07/2021 Site Technique Total Dose (Gy) Dose per Fx (Gy) Completed Fx Beam Energies  Breast, Right: Breast_Rt 3D 42.56/42.56 2.66 16/16 6X  Breast, Right: Breast_Rt_Bst specialPort 8/8 2 4/4 15E   Narrative:  The patient was contacted today for routine follow-up. During treatment she did very well with radiotherapy and did not have significant desquamation.   Impression/Plan: 1. 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 The patient has been doing well since completion of radiotherapy. We discussed that we would be happy to continue to follow her as needed, but she will also continue to follow up with Dr. Jana Hakim in medical oncology. She was counseled on skin care as well as measures to avoid sun exposure to this area.  2. Survivorship. We discussed the importance of survivorship evaluation and encouraged her to attend her upcoming visit with that clinic.     Carola Rhine, PAC

## 2021-06-02 ENCOUNTER — Telehealth: Payer: Self-pay | Admitting: *Deleted

## 2021-06-02 NOTE — Telephone Encounter (Signed)
Pt called to this RN due to need to clarify when she should obtain her next mammogram since having breast cancer diagnosed this past Feb.  She states above was discussed at her last visit - with MD advising to wait at least 6 weeks post Covid booster- but she was called by the breast center to schedule " and they were trying to do this month but then they said I would have to pay out of pocket because it was too soon per insurance "  This RN reviewed chart and order- per order - noted as " new baseline study " with expected date to be done as 08/24/2021 which would allow her to obtain Covid booster with recovery time for new baseline mammo.  Pt verbalized understanding and she will call the Breast Center - if they question further, this RN asked the patient to have them call this RN.

## 2021-06-04 DIAGNOSIS — Z20828 Contact with and (suspected) exposure to other viral communicable diseases: Secondary | ICD-10-CM | POA: Diagnosis not present

## 2021-06-09 DIAGNOSIS — Z23 Encounter for immunization: Secondary | ICD-10-CM | POA: Diagnosis not present

## 2021-07-21 DIAGNOSIS — Z170 Estrogen receptor positive status [ER+]: Secondary | ICD-10-CM | POA: Diagnosis not present

## 2021-07-21 DIAGNOSIS — C50411 Malignant neoplasm of upper-outer quadrant of right female breast: Secondary | ICD-10-CM | POA: Diagnosis not present

## 2021-08-02 DIAGNOSIS — I10 Essential (primary) hypertension: Secondary | ICD-10-CM | POA: Diagnosis not present

## 2021-08-02 DIAGNOSIS — R519 Headache, unspecified: Secondary | ICD-10-CM | POA: Diagnosis not present

## 2021-08-02 DIAGNOSIS — U071 COVID-19: Secondary | ICD-10-CM | POA: Diagnosis not present

## 2021-08-25 ENCOUNTER — Ambulatory Visit
Admission: RE | Admit: 2021-08-25 | Discharge: 2021-08-25 | Disposition: A | Discharge status: Home / Self Care | Payer: Medicare Other | Source: Ambulatory Visit | Attending: Oncology | Admitting: Oncology

## 2021-08-25 ENCOUNTER — Other Ambulatory Visit: Payer: Self-pay

## 2021-08-25 DIAGNOSIS — Z170 Estrogen receptor positive status [ER+]: Secondary | ICD-10-CM

## 2021-08-25 DIAGNOSIS — R922 Inconclusive mammogram: Secondary | ICD-10-CM | POA: Diagnosis not present

## 2021-08-25 DIAGNOSIS — M8588 Other specified disorders of bone density and structure, other site: Secondary | ICD-10-CM

## 2021-08-31 DIAGNOSIS — Z23 Encounter for immunization: Secondary | ICD-10-CM | POA: Diagnosis not present

## 2021-09-13 NOTE — Progress Notes (Addendum)
[facility]  [phone] [phone]     ID: [name] DOB: January 24, 1941  MR#: [id number]  KGU#:[id number]  Patient Care Team: Celene Squibb, MD as PCP - General (Internal Medicine) Estill Dooms, NP as Nurse Practitioner (Obstetrics and Gynecology) Jovita Kussmaul, MD as Consulting Physician (General Surgery) Bobbi Kozakiewicz, Virgie Dad, MD as Consulting Physician (Oncology) Rogene Houston, MD as Consulting Physician (Gastroenterology) Register, Luetta Nutting, PA-C as Physician Assistant (Dermatology) Kyung Rudd, MD as Consulting Physician (Radiation Oncology) Chauncey Cruel, MD OTHER MD:  CHIEF COMPLAINT: Estrogen receptor positive breast cancer  CURRENT TREATMENT: anastrozole   INTERVAL HISTORY: [name] returns today for follow up of her estrogen receptor positive breast cancer.   She was started on anastrozole at her last visit on 05/03/2021.  She is having absolutely no side effects from this that she is aware of and specifically no issues with hot flashes or vaginal dryness.  Since her last visit, she underwent bilateral diagnostic mammography with tomography at [facility] on 08/25/2021 showing: breast density category C; no evidence of malignancy in either breast.  She is scheduled for bone density screening on 11/19/2021.    REVIEW OF SYSTEMS: [name] does all her usual activities of daily living and housework but does not otherwise exercise.  She does have a 63-year-old dog that she could walk she says.  She is already scheduled for a bone density in January.  She tells me when she went for mammography they told her it might cost her $800 because it was less than a year.  Of course this was her new baseline mammogram and that information was incorrect.  I believe the mammography group has a ready fracture that for her.  Detailed review of systems today was otherwise stable.  COVID 19 VACCINATION STATUS: fully vaccinated Levan Hurst), with booster  07/2020   HISTORY OF CURRENT ILLNESS: From the original intake note:  [name] had routine screening mammography on 05/11/2020 showing a possible abnormality in the right breast. She underwent right diagnostic mammography with tomography and right breast ultrasonography at [facility] on 05/15/2020 showing: breast density category B; probable benign 1.1 cm mass in right breast at 9 o'clock. A six-month follow up right breast ultrasound was recommended.  Repeat right breast ultrasound was performed on 01/01/2021 showing: mild interval increase in size and irregularity of previously demonstrated mass in right breast at 9 o'clock, now 1.3 cm; normal-appearing right axillary lymph nodes.  Accordingly on the same day, she proceeded to biopsy of the right breast area in question. The pathology from this procedure ([id number]) showed: ductal carcinoma, with differential including papillary carcinoma. Prognostic indicators significant for: estrogen receptor, 95% positive and progesterone receptor, 5% positive, both with strong staining intensity. Proliferation marker Ki67 at 15%. HER2 equivocal by immunohistochemistry (2+), but negative by fluorescent in situ hybridization with a signals ratio 1.38 and number per cell 2.  Cancer Staging Malignant neoplasm of upper-outer quadrant of right breast in female, estrogen receptor positive ([place]) Staging form: Breast, AJCC 8th Edition - Clinical: cT1c, cN0, cM0, GX, ER+, PR+, HER2- - Signed by Chauncey Cruel, MD on 01/19/2021 - Pathologic stage from 01/29/2021: Stage IA (pT1b, pN0, cM0, G2, ER+, PR+, HER2-) - Signed by Gardenia Phlegm, NP on 02/17/2021 Stage prefix: Initial diagnosis Histologic grading system: 3 grade system  The patient's subsequent history is as detailed below.   PAST MEDICAL HISTORY: Past Medical History:  Diagnosis Date   Anxiety  Breast cancer ([place]) 01/05/2021   12/2020 Ductal carcinoma right breast    Breast mass,  left 08/24/2015   Family history of breast cancer    Family history of pancreatic cancer    Hypertension    Positive fecal occult blood test 02/20/2014   Will send 3 cards home    PAST SURGICAL HISTORY: Past Surgical History:  Procedure Laterality Date   BREAST CYST EXCISION     BREAST EXCISIONAL BIOPSY Bilateral >10+ years ago   benign   BREAST LUMPECTOMY WITH RADIOACTIVE SEED AND SENTINEL LYMPH NODE BIOPSY Right 01/29/2021   Procedure: RIGHT BREAST LUMPECTOMY WITH RADIOACTIVE SEED AND SENTINEL LYMPH NODE BIOPSY;  Surgeon: Jovita Kussmaul, MD;  Location: [facility];  Service: General;  Laterality: Right;  START TIME OF 7:30 AM FOR 90 MINUTES ROOM 1   BREAST SURGERY Left    partail mastectomy-benign   CATARACT EXTRACTION, BILATERAL     [place]   CHOLECYSTECTOMY N/A 11/29/2013   Procedure: LAPAROSCOPIC CHOLECYSTECTOMY;  Surgeon: Scherry Ran, MD;  Location: [facility];  Service: General;  Laterality: N/A;   COLONOSCOPY N/A 03/14/2014   Procedure: COLONOSCOPY;  Surgeon: Rogene Houston, MD;  [facility];  Service: Endoscopy;  Laterality: N/A;  1250   COLONOSCOPY N/A 12/20/2019   Procedure: COLONOSCOPY;  Surgeon: Rogene Houston, MD;  [facility];  Service: Endoscopy;  Laterality: N/A;  1020   EYE SURGERY     Bilateral cataracts   MASTECTOMY     Partial left breast   POLYPECTOMY  12/20/2019   Procedure: POLYPECTOMY;  Surgeon: Rogene Houston, MD;  [facility];  Service: Endoscopy;;   TONSILLECTOMY      FAMILY HISTORY: Family History  Problem Relation Age of Onset   Cancer Mother        lung   Cancer Father        lung   Hypertension Father    Heart disease Maternal Grandfather    Heart attack Maternal Grandfather    Heart disease Paternal Grandfather    Heart attack Paternal Grandfather    Cancer Other        breast dx 39   Arthritis Sister        rheumatoid   COPD Sister    Breast cancer Sister 17   Breast cancer Cousin        dx 17s    Pancreatic cancer Cousin        d. 35s  The patient's father died at age 60 from lung cancer in the setting o of tobacco abuse.  The patient's mother died at age 37 from the same.  The patient's mother had a niece with breast cancer in her 27s.  The patient herself had 2 sisters and 1 brother, and one of her sisters had breast cancer.  The patient herself has a niece (daughter from her sister who does not have breast cancer) with breast cancer diagnosed at age 20.  The patient also has 1 brother with no history of cancer   GYNECOLOGIC HISTORY:  No LMP recorded. Patient is postmenopausal. Menarche: 80 years old GX P 0 LMP age 12 Contraceptive HRT no Hysterectomy? no BSO? 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  Her husband Wille Glaser used to be a Ambulance person.  Their 2 (adopted) children are Yong Channel lives near [employer] and sells surgical equipment and  Gilmore Laroche who is a Pharmacist, hospital in [place].  The patient has 5 grandchildren.  She is expecting her great grandchild in the next few months.  She is a Tourist information centre manager.    ADVANCED DIRECTIVES: In the absence of any documentation to the contrary, the patient's spouse is their HCPOA.    HEALTH MAINTENANCE: Social History   Tobacco Use   Smoking status: Former    Packs/day: 0.50    Years: 4.00    Pack years: 2.00    Types: Cigarettes    Quit date: 11/29/1967    Years since quitting: 53.8   Smokeless tobacco: Never  Vaping Use   Vaping Use: Never used  Substance Use Topics   Alcohol use: Yes    Alcohol/week: 5.0 standard drinks    Types: 5 Glasses of wine per week    Comment: social   Drug use: No     Colonoscopy: 12/2019 (Dr. Laural Golden), recall 2026  PAP: 02/2014, negative; repeat not indicated  Bone density: 2010?   No Known Allergies  Current Outpatient Medications  Medication Sig Dispense Refill   cholecalciferol (VITAMIN D3) 25 MCG (1000  UNIT) tablet Take 1 tablet (1,000 Units total) by mouth daily.     anastrozole (ARIMIDEX) 1 MG tablet Take 1 tablet (1 mg total) by mouth daily. 90 tablet 4   Ascorbic Acid (VITAMIN C GUMMIES PO) Take 250 mg by mouth daily.     Cholecalciferol (VITAMIN D3 GUMMIES ADULT PO) Take 2,000 Units by mouth daily.     losartan (COZAAR) 50 MG tablet Take 1 tablet (50 mg total) by mouth daily.     No current facility-administered medications for this visit.    OBJECTIVE: White woman who appears younger than stated age  33:   09/14/21 1356  BP: (!) 171/88  Pulse: 88  Resp: 18  Temp: 98.1 F (36.7 C)  SpO2: 100%       Body mass index is 30.91 kg/m.   Wt Readings from Last 3 Encounters:  09/14/21 174 lb 8 oz (79.2 kg)  05/03/21 173 lb 8 oz (78.7 kg)  03/03/21 170 lb 8 oz (77.3 kg)     ECOG FS:1 - Symptomatic but completely ambulatory  Sclerae unicteric, EOMs intact Wearing a mask No cervical or supraclavicular adenopathy Lungs no rales or rhonchi Heart regular rate and rhythm Abd soft, nontender, positive bowel sounds MSK no focal spinal tenderness, no upper extremity lymphedema Neuro: nonfocal, well oriented, appropriate affect Breasts: The right breast is status post biopsy and radiation.  There is no evidence of local recurrence.  The left breast and both axillae are benign.   LAB RESULTS:  CMP     Component Value Date/Time   NA 142 09/14/2021 1342   K 3.9 09/14/2021 1342   CL 110 09/14/2021 1342   CO2 25 09/14/2021 1342   GLUCOSE 95 09/14/2021 1342   BUN 20 09/14/2021 1342   CREATININE 0.74 09/14/2021 1342   CREATININE 0.84 01/19/2021 1514   CREATININE 0.63 02/20/2014 1011   CALCIUM 9.3 09/14/2021 1342   PROT 6.8 09/14/2021 1342   ALBUMIN 4.0 09/14/2021 1342   AST 10 (L) 09/14/2021 1342   AST 11 (L) 01/19/2021 1514   ALT 15 09/14/2021 1342   ALT 16 01/19/2021 1514   ALKPHOS 75 09/14/2021 1342   BILITOT 0.5 09/14/2021 1342   BILITOT 0.6 01/19/2021 1514    GFRNONAA >60 09/14/2021 1342   GFRNONAA >60 01/19/2021 1514   GFRAA >90 11/30/2013 0634    No  results found for: TOTALPROTELP, ALBUMINELP, A1GS, A2GS, BETS, BETA2SER, GAMS, MSPIKE, SPEI  Lab Results  Component Value Date   WBC 5.6 09/14/2021   NEUTROABS 3.6 09/14/2021   HGB 14.0 09/14/2021   HCT 41.7 09/14/2021   MCV 91.0 09/14/2021   PLT 215 09/14/2021    No results found for: LABCA2  No components found for: JOACZY606  No results for input(s): INR in the last 168 hours.  No results found for: LABCA2  No results found for: TKZ601  No results found for: UXN235  No results found for: TDD220  No results found for: CA2729  No components found for: HGQUANT  No results found for: CEA1 / No results found for: CEA1   No results found for: AFPTUMOR  No results found for: CHROMOGRNA  No results found for: KPAFRELGTCHN, LAMBDASER, KAPLAMBRATIO (kappa/lambda light chains)  No results found for: HGBA, HGBA2QUANT, HGBFQUANT, HGBSQUAN (Hemoglobinopathy evaluation)   No results found for: LDH  No results found for: IRON, TIBC, IRONPCTSAT (Iron and TIBC)  No results found for: FERRITIN  Urinalysis No results found for: COLORURINE, APPEARANCEUR, LABSPEC, PHURINE, GLUCOSEU, HGBUR, BILIRUBINUR, KETONESUR, PROTEINUR, UROBILINOGEN, NITRITE, LEUKOCYTESUR   STUDIES: MM DIAG BREAST TOMO BILATERAL  Result Date: 08/25/2021 CLINICAL DATA:  80 year old female for follow-up of RIGHT breast cancer with lumpectomy in 2022.Also for annual bilateral mammogram. EXAM: DIGITAL DIAGNOSTIC BILATERAL MAMMOGRAM WITH CAD AND TOMO COMPARISON:  Previous exam(s). ACR Breast Density Category c: The breast tissue is heterogeneously dense, which may obscure small masses. FINDINGS: 2D and 3D full field views of both breasts and a magnification view of the lumpectomy site demonstrate no suspicious mass, nonsurgical distortion or worrisome calcifications. RIGHT lumpectomy changes are present.  Mammographic images were processed with CAD. IMPRESSION: No evidence of breast malignancy. RECOMMENDATION: Bilateral diagnostic mammogram in 1 year. I have discussed the findings and recommendations with the patient. If applicable, a reminder letter will be sent to the patient regarding the next appointment. BI-RADS CATEGORY  2: Benign. Electronically Signed   By: Margarette Canada M.D.   On: 08/25/2021 09:03    ELIGIBLE FOR AVAILABLE RESEARCH PROTOCOL: no  ASSESSMENT: 80 y.o. [place] woman status post right breast upper outer quadrant biopsy 01/01/2021 for a clinical T1c N0 (invasive) ductal carcinoma, grade not stated, estrogen and progesterone receptor positive, HER-2 not amplified, with an MIB-1 of 15%  (1) s/p right lumpectomy and sentinel node sampling 01/29/2021 for a pT1b pN0, stage IA mucinous invasive ductal carcinoma, grade 2, with negative margins  (2) adjuvant radiation4/28/2022 through 04/07/2021 Site Technique Total Dose (Gy) Dose per Fx (Gy) Completed Fx Beam Energies  Breast, Right: Breast_Rt 3D 42.56/42.56 2.66 16/16 6X  Breast, Right: Breast_Rt_Bst specialPort 8/8 2 4/4 15E    (3) patient decided against genetics testing  (4) anastrozole started 05/04/2019  (A) bone scan 11/19/2021   PLAN:  Bevin is now a little over half year out from definitive surgery for her breast cancer with no evidence of disease recurrence.  She is tolerating anastrozole remarkably well and the plan will be to continue that a total of 5 years.  I encouraged her to develop walking program and I set up a goal of 45 minutes a day 5 days a week.  She will tell us next visit how she is doing with that.  Otherwise she will have repeat mammography in October and considering how well she is doing we will see her again November 2023  Total encounter time 20 minutes.Sarajane Jews C. Dejae Bernet, MD  09/14/2021 2:46 PM Medical Oncology and Hematology [facility] [place], [place]  [address] Tel. [phone]    Fax. [phone]   This document serves as a record of services personally performed by Lurline Del, MD. It was created on his behalf by Wilburn Mylar, a trained medical scribe. The creation of this record is based on the scribe's personal observations and the provider's statements to them.   I, Lurline Del MD, have reviewed the above documentation for accuracy and completeness, and I agree with the above.   *Total Encounter Time as defined by the Centers for Medicare and Medicaid Services includes, in addition to the face-to-face time of a patient visit (documented in the note above) non-face-to-face time: obtaining and reviewing outside history, ordering and reviewing medications, tests or procedures, care coordination (communications with other health care professionals or caregivers) and documentation in the medical record.

## 2021-09-14 ENCOUNTER — Inpatient Hospital Stay (HOSPITAL_BASED_OUTPATIENT_CLINIC_OR_DEPARTMENT_OTHER): Payer: Medicare Other | Admitting: Oncology

## 2021-09-14 ENCOUNTER — Inpatient Hospital Stay: Payer: Medicare Other | Attending: Oncology

## 2021-09-14 ENCOUNTER — Other Ambulatory Visit: Payer: Self-pay

## 2021-09-14 VITALS — BP 171/88 | HR 88 | Temp 98.1°F | Resp 18 | Ht 63.0 in | Wt 174.5 lb

## 2021-09-14 DIAGNOSIS — C50411 Malignant neoplasm of upper-outer quadrant of right female breast: Secondary | ICD-10-CM | POA: Diagnosis not present

## 2021-09-14 DIAGNOSIS — Z170 Estrogen receptor positive status [ER+]: Secondary | ICD-10-CM | POA: Insufficient documentation

## 2021-09-14 DIAGNOSIS — M8588 Other specified disorders of bone density and structure, other site: Secondary | ICD-10-CM

## 2021-09-14 DIAGNOSIS — Z87891 Personal history of nicotine dependence: Secondary | ICD-10-CM | POA: Insufficient documentation

## 2021-09-14 DIAGNOSIS — Z79899 Other long term (current) drug therapy: Secondary | ICD-10-CM | POA: Insufficient documentation

## 2021-09-14 DIAGNOSIS — Z79811 Long term (current) use of aromatase inhibitors: Secondary | ICD-10-CM | POA: Diagnosis not present

## 2021-09-14 LAB — CBC WITH DIFFERENTIAL/PLATELET
Abs Immature Granulocytes: 0.01 10*3/uL (ref 0.00–0.07)
Basophils Absolute: 0 10*3/uL (ref 0.0–0.1)
Basophils Relative: 0 %
Eosinophils Absolute: 0.1 10*3/uL (ref 0.0–0.5)
Eosinophils Relative: 1 %
HCT: 41.7 % (ref 36.0–46.0)
Hemoglobin: 14 g/dL (ref 12.0–15.0)
Immature Granulocytes: 0 %
Lymphocytes Relative: 26 %
Lymphs Abs: 1.5 10*3/uL (ref 0.7–4.0)
MCH: 30.6 pg (ref 26.0–34.0)
MCHC: 33.6 g/dL (ref 30.0–36.0)
MCV: 91 fL (ref 80.0–100.0)
Monocytes Absolute: 0.5 10*3/uL (ref 0.1–1.0)
Monocytes Relative: 9 %
Neutro Abs: 3.6 10*3/uL (ref 1.7–7.7)
Neutrophils Relative %: 64 %
Platelets: 215 10*3/uL (ref 150–400)
RBC: 4.58 MIL/uL (ref 3.87–5.11)
RDW: 13.2 % (ref 11.5–15.5)
WBC: 5.6 10*3/uL (ref 4.0–10.5)
nRBC: 0 % (ref 0.0–0.2)

## 2021-09-14 LAB — COMPREHENSIVE METABOLIC PANEL
ALT: 15 U/L (ref 0–44)
AST: 10 U/L — ABNORMAL LOW (ref 15–41)
Albumin: 4 g/dL (ref 3.5–5.0)
Alkaline Phosphatase: 75 U/L (ref 38–126)
Anion gap: 7 (ref 5–15)
BUN: 20 mg/dL (ref 8–23)
CO2: 25 mmol/L (ref 22–32)
Calcium: 9.3 mg/dL (ref 8.9–10.3)
Chloride: 110 mmol/L (ref 98–111)
Creatinine, Ser: 0.74 mg/dL (ref 0.44–1.00)
GFR, Estimated: >60 mL/min (ref >60)
Glucose, Bld: 95 mg/dL (ref 70–99)
Potassium: 3.9 mmol/L (ref 3.5–5.1)
Sodium: 142 mmol/L (ref 135–145)
Total Bilirubin: 0.5 mg/dL (ref 0.3–1.2)
Total Protein: 6.8 g/dL (ref 6.5–8.1)

## 2021-09-14 LAB — VITAMIN D 25 HYDROXY (VIT D DEFICIENCY, FRACTURES): Vit D, 25-Hydroxy: 25.45 ng/mL — ABNORMAL LOW (ref 30–100)

## 2021-09-14 MED ORDER — ANASTROZOLE 1 MG PO TABS: 1.0000 mg | ORAL_TABLET | Freq: Every day | ORAL | 4 refills | Status: DC

## 2021-09-15 ENCOUNTER — Encounter: Payer: Self-pay | Admitting: Oncology

## 2021-09-15 ENCOUNTER — Other Ambulatory Visit: Payer: Self-pay | Admitting: Oncology

## 2021-11-19 ENCOUNTER — Other Ambulatory Visit: Payer: Self-pay

## 2021-11-19 ENCOUNTER — Ambulatory Visit
Admission: RE | Admit: 2021-11-19 | Discharge: 2021-11-19 | Disposition: A | Discharge status: Home / Self Care | Payer: Medicare Other | Source: Ambulatory Visit | Attending: Oncology | Admitting: Oncology

## 2021-11-19 DIAGNOSIS — M810 Age-related osteoporosis without current pathological fracture: Secondary | ICD-10-CM | POA: Diagnosis not present

## 2021-11-19 DIAGNOSIS — Z780 Asymptomatic menopausal state: Secondary | ICD-10-CM | POA: Diagnosis not present

## 2021-11-19 DIAGNOSIS — Z170 Estrogen receptor positive status [ER+]: Secondary | ICD-10-CM

## 2021-11-19 DIAGNOSIS — C50411 Malignant neoplasm of upper-outer quadrant of right female breast: Secondary | ICD-10-CM

## 2021-11-19 DIAGNOSIS — M8588 Other specified disorders of bone density and structure, other site: Secondary | ICD-10-CM

## 2021-11-24 ENCOUNTER — Telehealth: Payer: Self-pay | Admitting: *Deleted

## 2021-11-24 NOTE — Telephone Encounter (Signed)
-----   Message from Benay Pike, MD sent at 11/24/2021  1:24 PM EST ----- Val,  I dont know if I sent this message already. I dont see her on any treatment for osteoporosis. Can you see if she is on anything, if not, could you please see if she wants to come for a tele visit to discuss about this.  Thanks,

## 2021-11-24 NOTE — Progress Notes (Signed)
Left VM for return call

## 2021-11-24 NOTE — Telephone Encounter (Signed)
This RN called pt - obtained VM - message left requesting a return call to discuss bone density results. This RN name given for contact.

## 2021-11-25 ENCOUNTER — Telehealth: Payer: Self-pay | Admitting: *Deleted

## 2021-11-25 NOTE — Telephone Encounter (Signed)
This RN spoke with pt per prior VM left by this RN regarding the patient's recent bone density report.  Per discussion- pt is taking vitamin D3 5000iu daily  She has not been consistent with weight bearing exercise at least 3 x a week .  Oral calcium causes severe constipation.  Per discussion she is currently traveling to her residence in [place]-   She states she will increase her calcium by food source - as well as institute her walking program "Dr Jana Hakim reminded me at my last visit that I needed to get back on my walking program ".  If visit still recommended she is agreeable - this note will be forwarded to

## 2022-01-18 DIAGNOSIS — Z170 Estrogen receptor positive status [ER+]: Secondary | ICD-10-CM | POA: Diagnosis not present

## 2022-01-18 DIAGNOSIS — C50411 Malignant neoplasm of upper-outer quadrant of right female breast: Secondary | ICD-10-CM | POA: Diagnosis not present

## 2022-02-02 DIAGNOSIS — Z853 Personal history of malignant neoplasm of breast: Secondary | ICD-10-CM | POA: Diagnosis not present

## 2022-02-02 DIAGNOSIS — I10 Essential (primary) hypertension: Secondary | ICD-10-CM | POA: Diagnosis not present

## 2022-02-02 DIAGNOSIS — M8580 Other specified disorders of bone density and structure, unspecified site: Secondary | ICD-10-CM | POA: Diagnosis not present

## 2022-02-02 DIAGNOSIS — Z86010 Personal history of colonic polyps: Secondary | ICD-10-CM | POA: Diagnosis not present

## 2022-03-30 ENCOUNTER — Encounter (HOSPITAL_COMMUNITY): Payer: Self-pay

## 2022-06-17 ENCOUNTER — Other Ambulatory Visit: Payer: Self-pay | Admitting: Hematology and Oncology

## 2022-06-17 DIAGNOSIS — R928 Other abnormal and inconclusive findings on diagnostic imaging of breast: Secondary | ICD-10-CM

## 2022-07-19 DIAGNOSIS — Z170 Estrogen receptor positive status [ER+]: Secondary | ICD-10-CM | POA: Diagnosis not present

## 2022-07-19 DIAGNOSIS — C50411 Malignant neoplasm of upper-outer quadrant of right female breast: Secondary | ICD-10-CM | POA: Diagnosis not present

## 2022-07-27 DIAGNOSIS — I10 Essential (primary) hypertension: Secondary | ICD-10-CM | POA: Diagnosis not present

## 2022-07-27 DIAGNOSIS — Z23 Encounter for immunization: Secondary | ICD-10-CM | POA: Diagnosis not present

## 2022-08-29 ENCOUNTER — Ambulatory Visit
Admission: RE | Admit: 2022-08-29 | Discharge: 2022-08-29 | Disposition: A | Discharge status: Home / Self Care | Payer: Medicare Other | Source: Ambulatory Visit | Attending: Hematology and Oncology | Admitting: Hematology and Oncology

## 2022-08-29 DIAGNOSIS — R92333 Mammographic heterogeneous density, bilateral breasts: Secondary | ICD-10-CM | POA: Diagnosis not present

## 2022-08-29 DIAGNOSIS — Z853 Personal history of malignant neoplasm of breast: Secondary | ICD-10-CM | POA: Diagnosis not present

## 2022-08-29 DIAGNOSIS — R928 Other abnormal and inconclusive findings on diagnostic imaging of breast: Secondary | ICD-10-CM

## 2022-09-13 DIAGNOSIS — Z853 Personal history of malignant neoplasm of breast: Secondary | ICD-10-CM | POA: Diagnosis not present

## 2022-09-13 DIAGNOSIS — M8580 Other specified disorders of bone density and structure, unspecified site: Secondary | ICD-10-CM | POA: Diagnosis not present

## 2022-09-13 DIAGNOSIS — I10 Essential (primary) hypertension: Secondary | ICD-10-CM | POA: Diagnosis not present

## 2022-09-13 DIAGNOSIS — L989 Disorder of the skin and subcutaneous tissue, unspecified: Secondary | ICD-10-CM | POA: Diagnosis not present

## 2022-09-13 DIAGNOSIS — Z0000 Encounter for general adult medical examination without abnormal findings: Secondary | ICD-10-CM | POA: Diagnosis not present

## 2022-09-13 DIAGNOSIS — Z86010 Personal history of colonic polyps: Secondary | ICD-10-CM | POA: Diagnosis not present

## 2022-09-13 DIAGNOSIS — Z136 Encounter for screening for cardiovascular disorders: Secondary | ICD-10-CM | POA: Diagnosis not present

## 2022-09-13 DIAGNOSIS — Z13220 Encounter for screening for lipoid disorders: Secondary | ICD-10-CM | POA: Diagnosis not present

## 2022-09-14 ENCOUNTER — Other Ambulatory Visit: Payer: Self-pay | Admitting: *Deleted

## 2022-09-14 DIAGNOSIS — C50411 Malignant neoplasm of upper-outer quadrant of right female breast: Secondary | ICD-10-CM

## 2022-09-14 DIAGNOSIS — Z170 Estrogen receptor positive status [ER+]: Secondary | ICD-10-CM

## 2022-09-15 ENCOUNTER — Other Ambulatory Visit: Payer: Self-pay

## 2022-09-15 ENCOUNTER — Encounter: Payer: Self-pay | Admitting: Hematology and Oncology

## 2022-09-15 ENCOUNTER — Inpatient Hospital Stay (HOSPITAL_BASED_OUTPATIENT_CLINIC_OR_DEPARTMENT_OTHER): Payer: Medicare Other | Admitting: Hematology and Oncology

## 2022-09-15 ENCOUNTER — Inpatient Hospital Stay: Payer: Medicare Other | Attending: Hematology and Oncology

## 2022-09-15 VITALS — BP 165/93 | HR 75 | Temp 97.5°F | Resp 14 | Wt 179.0 lb

## 2022-09-15 DIAGNOSIS — C50411 Malignant neoplasm of upper-outer quadrant of right female breast: Secondary | ICD-10-CM

## 2022-09-15 DIAGNOSIS — I10 Essential (primary) hypertension: Secondary | ICD-10-CM | POA: Insufficient documentation

## 2022-09-15 DIAGNOSIS — Z170 Estrogen receptor positive status [ER+]: Secondary | ICD-10-CM | POA: Diagnosis not present

## 2022-09-15 DIAGNOSIS — Z79811 Long term (current) use of aromatase inhibitors: Secondary | ICD-10-CM | POA: Insufficient documentation

## 2022-09-15 DIAGNOSIS — Z79899 Other long term (current) drug therapy: Secondary | ICD-10-CM | POA: Diagnosis not present

## 2022-09-15 DIAGNOSIS — M8588 Other specified disorders of bone density and structure, other site: Secondary | ICD-10-CM | POA: Diagnosis not present

## 2022-09-15 NOTE — Progress Notes (Signed)
[place]  [phone] [phone]     ID: [name] DOB: 1940-12-25  MR#: [id number]  BOF#:[id number]  Patient Care Team: Janice Squibb, MD as PCP - General (Internal Medicine) Janice Dooms, NP as Nurse Practitioner (Obstetrics and Gynecology) Janice Kussmaul, MD as Consulting Physician (General Surgery) [name], Janice Dad, MD (Inactive) as Consulting Physician (Oncology) Janice Houston, MD as Consulting Physician (Gastroenterology) [name], Janice Nutting, PA-C as Physician Assistant (Dermatology) Janice Rudd, MD as Consulting Physician (Radiation Oncology) Janice Pike, MD OTHER MD:  CHIEF COMPLAINT: Estrogen receptor positive breast cancer  CURRENT TREATMENT: anastrozole   INTERVAL HISTORY:  [name] returns today for follow up of her estrogen receptor positive breast cancer.  She is currently taking anastrozole for adjuvant antiestrogen therapy.  She is tolerating this very well.  She denies any complaints at all.  She had mammogram about 2-1/2 weeks ago which showed a C density breast but no evidence of malignancy.  She has not started on her exercise program yet for osteoporosis.  She cannot tolerate oral calcium.  She does take vitamin D supplementation.  Rest of the pertinent 10 point ROS reviewed and negative COVID 19 VACCINATION STATUS: fully vaccinated Levan Hurst), with booster 07/2020   HISTORY OF CURRENT ILLNESS: From the original intake note:  [name] had routine screening mammography on 05/11/2020 showing a possible abnormality in the right breast. She underwent right diagnostic mammography with tomography and right breast ultrasonography at [facility] on 05/15/2020 showing: breast density category B; probable benign 1.1 cm mass in right breast at 9 o'clock. A six-month follow up right breast ultrasound was recommended.  Repeat right breast ultrasound was performed on 01/01/2021 showing: mild interval increase in size and  irregularity of previously demonstrated mass in right breast at 9 o'clock, now 1.3 cm; normal-appearing right axillary lymph nodes.  Accordingly on the same day, she proceeded to biopsy of the right breast area in question. The pathology from this procedure ([id number]) showed: ductal carcinoma, with differential including papillary carcinoma. Prognostic indicators significant for: estrogen receptor, 95% positive and progesterone receptor, 5% positive, both with strong staining intensity. Proliferation marker Ki67 at 15%. HER2 equivocal by immunohistochemistry (2+), but negative by fluorescent in situ hybridization with a signals ratio 1.38 and number per cell 2.   Cancer Staging  Malignant neoplasm of upper-outer quadrant of right breast in female, estrogen receptor positive ([place]) Staging form: Breast, AJCC 8th Edition - Clinical: cT1c, cN0, cM0, GX, ER+, PR+, HER2- - Signed by Janice Cruel, MD on 01/19/2021 - Pathologic stage from 01/29/2021: Stage IA (pT1b, pN0, cM0, G2, ER+, PR+, HER2-) - Signed by Janice Phlegm, NP on 02/17/2021 Stage prefix: Initial diagnosis Histologic grading system: 3 grade system   The patient's subsequent history is as detailed below.   PAST MEDICAL HISTORY: Past Medical History:  Diagnosis Date   Anxiety    Breast cancer ([place]) 01/05/2021   12/2020 Ductal carcinoma right breast    Breast mass, left 08/24/2015   Family history of breast cancer    Family history of pancreatic cancer    Hypertension    Positive fecal occult blood test 02/20/2014   Will send 3 cards home    PAST SURGICAL HISTORY: Past Surgical History:  Procedure Laterality Date   BREAST CYST EXCISION     BREAST EXCISIONAL BIOPSY Bilateral >10+ years ago   benign   BREAST LUMPECTOMY WITH RADIOACTIVE SEED AND SENTINEL LYMPH NODE BIOPSY Right 01/29/2021  Procedure: RIGHT BREAST LUMPECTOMY WITH RADIOACTIVE SEED AND SENTINEL LYMPH NODE BIOPSY;  Surgeon: Janice Kussmaul, MD;  Location:  [facility];  Service: General;  Laterality: Right;  START TIME OF 7:30 AM FOR 90 MINUTES ROOM 1   BREAST SURGERY Left    partail mastectomy-benign   CATARACT EXTRACTION, BILATERAL     [place]   CHOLECYSTECTOMY N/A 11/29/2013   Procedure: LAPAROSCOPIC CHOLECYSTECTOMY;  Surgeon: Janice Ran, MD;  Location[facility];  Service: General;  Laterality: N/A;   COLONOSCOPY N/A 03/14/2014   Procedure: COLONOSCOPY;  Surgeon: Janice Houston, MD;  [facility];  Service: Endoscopy;  Laterality: N/A;  1250   COLONOSCOPY N/A 12/20/2019   Procedure: COLONOSCOPY;  Surgeon: Janice Houston, MD;  [facility];  Service: Endoscopy;  Laterality: N/A;  1020   EYE SURGERY     Bilateral cataracts   MASTECTOMY     Partial left breast   POLYPECTOMY  12/20/2019   Procedure: POLYPECTOMY;  Surgeon: Janice Houston, MD;  [facility];  Service: Endoscopy;;   TONSILLECTOMY      FAMILY HISTORY: Family History  Problem Relation Age of Onset   Cancer Mother        lung   Cancer Father        lung   Hypertension Father    Heart disease Maternal Grandfather    Heart attack Maternal Grandfather    Heart disease Paternal Grandfather    Heart attack Paternal Grandfather    Cancer Other        breast dx 49   Arthritis Sister        rheumatoid   COPD Sister    Breast cancer Sister 58   Breast cancer Cousin        dx 1s   Pancreatic cancer Cousin        d. 15s  The patient's father died at age 51 from lung cancer in the setting o of tobacco abuse.  The patient's mother died at age 62 from the same.  The patient's mother had a niece with breast cancer in her 64s.  The patient herself had 2 sisters and 1 brother, and one of her sisters had breast cancer.  The patient herself has a niece (daughter from her sister who does not have breast cancer) with breast cancer diagnosed at age 71.  The patient also has 1 brother with no history of cancer   GYNECOLOGIC HISTORY:  No LMP recorded.  Patient is postmenopausal. Menarche: 81 years old GX P 0 LMP age 52 Contraceptive HRT no Hysterectomy? no BSO? 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  Her husband Wille Glaser used to be a Ambulance person.  Their 2 (adopted) children are Yong Channel lives near [place] and sells surgical equipment and Gilmore Laroche who is a Pharmacist, hospital in [place].  The patient has 5 grandchildren.  She is expecting her great grandchild in the next few months.  She is a Tourist information centre manager.    ADVANCED DIRECTIVES: In the absence of any documentation to the contrary, the patient's spouse is their HCPOA.    HEALTH MAINTENANCE: Social History   Tobacco Use   Smoking status: Former    Packs/day: 0.50    Years: 4.00    Total pack years: 2.00    Types: Cigarettes    Quit date: 11/29/1967    Years since quitting: 38.8  Smokeless tobacco: Never  Vaping Use   Vaping Use: Never used  Substance Use Topics   Alcohol use: Yes    Alcohol/week: 5.0 standard drinks of alcohol    Types: 5 Glasses of wine per week    Comment: social   Drug use: No     Colonoscopy: 12/2019 (Dr. Laural Golden), recall 2026  PAP: 02/2014, negative; repeat not indicated  Bone density: 2010?   No Known Allergies  Current Outpatient Medications  Medication Sig Dispense Refill   anastrozole (ARIMIDEX) 1 MG tablet Take 1 tablet (1 mg total) by mouth daily. 90 tablet 4   Ascorbic Acid (VITAMIN C GUMMIES PO) Take 250 mg by mouth daily.     Cholecalciferol (VITAMIN D3 GUMMIES ADULT PO) Take 2,000 Units by mouth daily.     cholecalciferol (VITAMIN D3) 25 MCG (1000 UNIT) tablet Take 1 tablet (1,000 Units total) by mouth daily.     losartan (COZAAR) 50 MG tablet Take 1 tablet (50 mg total) by mouth daily.     No current facility-administered medications for this visit.    OBJECTIVE: White woman who appears younger than stated age  70:   09/15/22 0951  BP: (!)  165/93  Pulse: 75  Resp: 14  Temp: (!) 97.5 F (36.4 C)  SpO2: 100%       Body mass index is 31.71 kg/m.   Wt Readings from Last 3 Encounters:  09/15/22 179 lb (81.2 kg)  09/14/21 174 lb 8 oz (79.2 kg)  05/03/21 173 lb 8 oz (78.7 kg)     ECOG FS:1 - Symptomatic but completely ambulatory  Physical Exam Constitutional:      Appearance: Normal appearance.  Chest:     Comments: Bilateral breasts inspected.  No palpable masses or regional adenopathy. Musculoskeletal:     Cervical back: Normal range of motion and neck supple. No rigidity.  Lymphadenopathy:     Cervical: No cervical adenopathy.  Neurological:     Mental Status: She is alert.       LAB RESULTS:  CMP     Component Value Date/Time   NA 142 09/14/2021 1342   K 3.9 09/14/2021 1342   CL 110 09/14/2021 1342   CO2 25 09/14/2021 1342   GLUCOSE 95 09/14/2021 1342   BUN 20 09/14/2021 1342   CREATININE 0.74 09/14/2021 1342   CREATININE 0.84 01/19/2021 1514   CREATININE 0.63 02/20/2014 1011   CALCIUM 9.3 09/14/2021 1342   PROT 6.8 09/14/2021 1342   ALBUMIN 4.0 09/14/2021 1342   AST 10 (L) 09/14/2021 1342   AST 11 (L) 01/19/2021 1514   ALT 15 09/14/2021 1342   ALT 16 01/19/2021 1514   ALKPHOS 75 09/14/2021 1342   BILITOT 0.5 09/14/2021 1342   BILITOT 0.6 01/19/2021 1514   GFRNONAA >60 09/14/2021 1342   GFRNONAA >60 01/19/2021 1514   GFRAA >90 11/30/2013 0634    No results found for: "TOTALPROTELP", "ALBUMINELP", "A1GS", "A2GS", "BETS", "BETA2SER", "GAMS", "MSPIKE", "SPEI"  Lab Results  Component Value Date   WBC 5.6 09/14/2021   NEUTROABS 3.6 09/14/2021   HGB 14.0 09/14/2021   HCT 41.7 09/14/2021   MCV 91.0 09/14/2021   PLT 215 09/14/2021    No results found for: "LABCA2"  No components found for: "WTUUEK800"  No results for input(s): "INR" in the last 168 hours.  No results found for: "LABCA2"  No results found for: "LKJ179"  No results found for: "CAN125"  No results found for:  "XTA569"  No results found  for: "CA2729"  No components found for: "HGQUANT"  No results found for: "CEA1", "CEA" / No results found for: "CEA1", "CEA"   No results found for: "AFPTUMOR"  No results found for: "CHROMOGRNA"  No results found for: "KPAFRELGTCHN", "LAMBDASER", "KAPLAMBRATIO" (kappa/lambda light chains)  No results found for: "HGBA", "HGBA2QUANT", "HGBFQUANT", "HGBSQUAN" (Hemoglobinopathy evaluation)   No results found for: "LDH"  No results found for: "IRON", "TIBC", "IRONPCTSAT" (Iron and TIBC)  No results found for: "FERRITIN"  Urinalysis No results found for: "COLORURINE", "APPEARANCEUR", "LABSPEC", "PHURINE", "GLUCOSEU", "HGBUR", "BILIRUBINUR", "KETONESUR", "PROTEINUR", "UROBILINOGEN", "NITRITE", "LEUKOCYTESUR"   STUDIES: MM DIAG BREAST TOMO BILATERAL  Result Date: 08/29/2022 CLINICAL DATA:  Patient with history of right breast lumpectomy 2022. EXAM: DIGITAL DIAGNOSTIC BILATERAL MAMMOGRAM WITH TOMOSYNTHESIS TECHNIQUE: Bilateral digital diagnostic mammography and breast tomosynthesis was performed. COMPARISON:  Previous exam(s). ACR Breast Density Category c: The breast tissue is heterogeneously dense, which may obscure small masses. FINDINGS: Stable postlumpectomy changes right breast. No new masses, calcifications or nonsurgical distortion identified within either breast. IMPRESSION: Stable postlumpectomy changes right breast. No mammographic evidence for malignancy. RECOMMENDATION: Bilateral diagnostic mammography 1 year. I have discussed the findings and recommendations with the patient. If applicable, a reminder letter will be sent to the patient regarding the next appointment. BI-RADS CATEGORY  2: Benign. Electronically Signed   By: Lovey Newcomer M.D.   On: 08/29/2022 08:28    ELIGIBLE FOR AVAILABLE RESEARCH PROTOCOL: no  ASSESSMENT: 81 y.o.  woman status post right breast upper outer quadrant biopsy 01/01/2021 for a clinical T1c N0 (invasive)  ductal carcinoma, grade not stated, estrogen and progesterone receptor positive, HER-2 not amplified, with an MIB-1 of 15%  (1) s/p right lumpectomy and sentinel node sampling 01/29/2021 for a pT1b pN0, stage IA mucinous invasive ductal carcinoma, grade 2, with negative margins  (2) adjuvant radiation4/28/2022 through 04/07/2021 Site Technique Total Dose (Gy) Dose per Fx (Gy) Completed Fx Beam Energies  Breast, Right: Breast_Rt 3D 42.56/42.56 2.66 16/16 6X  Breast, Right: Breast_Rt_Bst specialPort 8/8 2 4/4 15E    (3) patient decided against genetics testing  (4) anastrozole started 05/04/2019  (A) bone scan 11/19/2021   PLAN:  Patient is here for follow-up on anastrozole.  She has been tolerating this very well.  She recently had a mammogram which was unremarkable. Physical examination today without any concerns. We have discussed about starting some exercises for osteoporosis especially since she is on anastrozole.  We have discussed about walking at least 30 minutes a day 5 times a week and also consider adding some weights to her exercise regimen.  She cannot tolerate oral calcium however takes vitamin D.  We have briefly discussed about bisphosphonate but at this time since these medications can have some side effects, she will start on exercise regimen first. Return to clinic in 1 year or sooner as needed.  She did not have labs today but she had some metabolic panel drawn recently with her PCP which did not show any major abnormalities.  She did not have a CBC drawn.  I have encouraged her to take care of this with her PCP once a year. Thank you for consulting [place] the care of this patient.  Please do not hesitate to contact us with any additional questions or concerns. Total time spent: 30 minutes.  *Total Encounter Time as defined by the Centers for Medicare and Medicaid Services includes, in addition to the face-to-face time of a patient visit (documented in the note above)  non-face-to-face time: obtaining  and reviewing outside history, ordering and reviewing medications, tests or procedures, care coordination (communications with other health care professionals or caregivers) and documentation in the medical record.

## 2022-09-22 ENCOUNTER — Ambulatory Visit
Admission: EM | Admit: 2022-09-22 | Discharge: 2022-09-22 | Disposition: A | Discharge status: Home / Self Care | Payer: Medicare Other | Attending: Nurse Practitioner | Admitting: Nurse Practitioner

## 2022-09-22 DIAGNOSIS — U071 COVID-19: Secondary | ICD-10-CM | POA: Diagnosis not present

## 2022-09-22 MED ORDER — NIRMATRELVIR/RITONAVIR (PAXLOVID)TABLET: 3.0000 | ORAL_TABLET | Freq: Two times a day (BID) | ORAL | 0 refills | Status: AC

## 2022-09-22 NOTE — Discharge Instructions (Signed)
Please start the Paxlovid to treat COVID-19.    Some other things that can make you feel better are: - Increased rest - Increasing fluid with water/sugar free electrolytes - Acetaminophen and ibuprofen as needed for fever/pain - Salt water gargling, chloraseptic spray and throat lozenges - OTC guaifenesin (Mucinex) 600 mg twice daily - Saline sinus flushes or a neti pot - Humidifying the air -Tessalon Perles during the day as needed for dry cough

## 2022-09-22 NOTE — ED Triage Notes (Signed)
Pt reports fever 101.0 F today; cough x 2 days.   Pt had positiev COVID test today.

## 2022-09-22 NOTE — ED Provider Notes (Signed)
[facility]    CSN: [id number] Arrival date & time: 09/22/22  1615      History   Chief Complaint Chief Complaint  Patient presents with   Cough   Fever    HPI [name] is a 81 y.o. female.   Patient presents for 2 to 3 days of productive cough, runny nose, postnasal drainage, hoarseness, sore throat, and slight fatigue.  She denies chest pain, shortness of breath, abdominal pain, nausea/vomiting, ear pain or drainage, diarrhea.  Reports she took an at home COVID test today and tested positive.  Reports she has had 4 COVID-19 vaccines.  She has not had the most recent booster.  Reports she had COVID-19 about 1 year ago.  Reports she had a checkup last week with her primary care provider and was told everything looked normal.  She denies any history of chronic kidney disease.    Past Medical History:  Diagnosis Date   Anxiety    Breast cancer ([place]) 01/05/2021   12/2020 Ductal carcinoma right breast    Breast mass, left 08/24/2015   Family history of breast cancer    Family history of pancreatic cancer    Hypertension    Positive fecal occult blood test 02/20/2014   Will send 3 cards home    Patient Active Problem List   Diagnosis Date Noted   Osteopenia 05/03/2021   Genetic testing 02/08/2021   Family history of breast cancer    Family history of pancreatic cancer    Malignant neoplasm of upper-outer quadrant of right breast in female, estrogen receptor positive ([place]) 01/19/2021   Hx of colonic polyps 09/18/2019   Breast mass, left 08/24/2015   Positive fecal occult blood test 02/20/2014   Cholecystitis with cholelithiasis 11/29/2013    Past Surgical History:  Procedure Laterality Date   BREAST CYST EXCISION     BREAST EXCISIONAL BIOPSY Bilateral >10+ years ago   benign   BREAST LUMPECTOMY WITH RADIOACTIVE SEED AND SENTINEL LYMPH NODE BIOPSY Right 01/29/2021   Procedure: RIGHT BREAST LUMPECTOMY WITH RADIOACTIVE SEED AND SENTINEL LYMPH NODE BIOPSY;   Surgeon: Jovita Kussmaul, MD;  Location: [place];  Service: General;  Laterality: Right;  START TIME OF 7:30 AM FOR 90 MINUTES ROOM 1   BREAST SURGERY Left    partail mastectomy-benign   CATARACT EXTRACTION, BILATERAL     [place]   CHOLECYSTECTOMY N/A 11/29/2013   Procedure: LAPAROSCOPIC CHOLECYSTECTOMY;  Surgeon: Scherry Ran, MD;  [facility];  Service: General;  Laterality: N/A;   COLONOSCOPY N/A 03/14/2014   Procedure: COLONOSCOPY;  Surgeon: Rogene Houston, MD;  [facility];  Service: Endoscopy;  Laterality: N/A;  1250   COLONOSCOPY N/A 12/20/2019   Procedure: COLONOSCOPY;  Surgeon: Rogene Houston, MD;  [facility];  Service: Endoscopy;  Laterality: N/A;  1020   EYE SURGERY     Bilateral cataracts   MASTECTOMY     Partial left breast   POLYPECTOMY  12/20/2019   Procedure: POLYPECTOMY;  Surgeon: Rogene Houston, MD;  [facility];  Service: Endoscopy;;   TONSILLECTOMY      OB History     Gravida  0   Para  0   Term  0   Preterm  0   AB  0   Living  0      SAB  0   IAB  0   Ectopic  0   Multiple  0  Live Births  0            Home Medications    Prior to Admission medications   Medication Sig Start Date End Date Taking? Authorizing Provider  nirmatrelvir/ritonavir EUA (PAXLOVID) 20 x 150 MG & 10 x '100MG'$  TABS Take 3 tablets by mouth 2 (two) times daily for 5 days. Patient GFR is >60.  Take nirmatrelvir (150 mg) two tablets twice daily for 5 days and ritonavir (100 mg) one tablet twice daily for 5 days. 09/22/22 09/27/22 Yes Eulogio Bear, NP  anastrozole (ARIMIDEX) 1 MG tablet Take 1 tablet (1 mg total) by mouth daily. 09/14/21   Magrinat, Virgie Dad, MD  Ascorbic Acid (VITAMIN C GUMMIES PO) Take 250 mg by mouth daily.    [provider]  Cholecalciferol (VITAMIN D3 GUMMIES ADULT PO) Take 2,000 Units by mouth daily.    [provider]  cholecalciferol (VITAMIN D3) 25 MCG (1000 UNIT) tablet  Take 1 tablet (1,000 Units total) by mouth daily. 09/14/21   Magrinat, Virgie Dad, MD  losartan (COZAAR) 50 MG tablet Take 1 tablet (50 mg total) by mouth daily. 09/14/21   Magrinat, Virgie Dad, MD    Family History Family History  Problem Relation Age of Onset   Cancer Mother        lung   Cancer Father        lung   Hypertension Father    Heart disease Maternal Grandfather    Heart attack Maternal Grandfather    Heart disease Paternal Grandfather    Heart attack Paternal Grandfather    Cancer Other        breast dx 36   Arthritis Sister        rheumatoid   COPD Sister    Breast cancer Sister 65   Breast cancer Cousin        dx 19s   Pancreatic cancer Cousin        d. 81s    Social History Social History   Tobacco Use   Smoking status: Former    Packs/day: 0.50    Years: 4.00    Total pack years: 2.00    Types: Cigarettes    Quit date: 11/29/1967    Years since quitting: 54.8   Smokeless tobacco: Never  Vaping Use   Vaping Use: Never used  Substance Use Topics   Alcohol use: Yes    Alcohol/week: 5.0 standard drinks of alcohol    Types: 5 Glasses of wine per week    Comment: social   Drug use: No     Allergies   Patient has no known allergies.   Review of Systems Review of Systems Per HPI  Physical Exam Triage Vital Signs ED Triage Vitals [09/22/22 1625]  Enc Vitals Group     BP (!) 141/84     Pulse Rate 93     Resp 18     Temp 98.8 F (37.1 C)     Temp Source Oral     SpO2 96 %     Weight      Height      Head Circumference      Peak Flow      Pain Score      Pain Loc      Pain Edu?      Excl. in [place]?    No data found.  Updated Vital Signs BP (!) 141/84 (BP Location: Right Arm)   Pulse 93   Temp 98.8 F (  37.1 C) (Oral)   Resp 18   SpO2 96%   Visual Acuity Right Eye Distance:   Left Eye Distance:   Bilateral Distance:    Right Eye Near:   Left Eye Near:    Bilateral Near:     Physical Exam Vitals and nursing note reviewed.   Constitutional:      General: She is not in acute distress.    Appearance: Normal appearance. She is not ill-appearing or toxic-appearing.  HENT:     Head: Normocephalic and atraumatic.     Right Ear: Tympanic membrane, ear canal and external ear normal.     Left Ear: Tympanic membrane, ear canal and external ear normal.     Nose: Congestion and rhinorrhea present.     Mouth/Throat:     Mouth: Mucous membranes are moist.     Pharynx: Oropharynx is clear. Posterior oropharyngeal erythema present. No oropharyngeal exudate.  Eyes:     General: No scleral icterus.    Extraocular Movements: Extraocular movements intact.  Cardiovascular:     Rate and Rhythm: Normal rate and regular rhythm.  Pulmonary:     Effort: Pulmonary effort is normal. No respiratory distress.     Breath sounds: Normal breath sounds. No wheezing, rhonchi or rales.  Abdominal:     General: Abdomen is flat. Bowel sounds are normal. There is no distension.     Palpations: Abdomen is soft.     Tenderness: There is no abdominal tenderness. There is no guarding.  Musculoskeletal:     Cervical back: Normal range of motion and neck supple.  Lymphadenopathy:     Cervical: No cervical adenopathy.  Skin:    General: Skin is warm and dry.     Coloration: Skin is not jaundiced or pale.     Findings: No erythema or rash.  Neurological:     Mental Status: She is alert and oriented to person, place, and time.  Psychiatric:        Behavior: Behavior is cooperative.      UC Treatments / Results  Labs (all labs ordered are listed, but only abnormal results are displayed) Labs Reviewed - No data to display  EKG   Radiology No results found.  Procedures Procedures (including critical care time)  Medications Ordered in UC Medications - No data to display  Initial Impression / Assessment and Plan / UC Course  I have reviewed the triage vital signs and the nursing notes.  Pertinent labs & imaging results that were  available during my care of the patient were reviewed by me and considered in my medical decision making (see chart for details).   Patient is well-appearing, normotensive, afebrile, not tachycardic, not tachypneic, oxygenating well on room air.    COVID-19 Treat with Paxlovid Last GFR greater than 60 Symptomatic care discussed including starting Mucinex, can use Tessalon Perles that has been prescribed previously ER and return precautions discussed with patient  The patient was given the opportunity to ask questions.  All questions answered to their satisfaction.  The patient is in agreement to this plan.    Final Clinical Impressions(s) / UC Diagnoses   Final diagnoses:  [id number]     Discharge Instructions      Please start the Paxlovid to treat COVID-19.    Some other things that can make you feel better are: - Increased rest - Increasing fluid with water/sugar free electrolytes - Acetaminophen and ibuprofen as needed for fever/pain - Salt water gargling, chloraseptic spray and throat  lozenges - OTC guaifenesin (Mucinex) 600 mg twice daily - Saline sinus flushes or a neti pot - Humidifying the air -Tessalon Perles during the day as needed for dry cough     ED Prescriptions     Medication Sig Dispense Auth. Provider   nirmatrelvir/ritonavir EUA (PAXLOVID) 20 x 150 MG & 10 x '100MG'$  TABS Take 3 tablets by mouth 2 (two) times daily for 5 days. Patient GFR is >60.  Take nirmatrelvir (150 mg) two tablets twice daily for 5 days and ritonavir (100 mg) one tablet twice daily for 5 days. 30 tablet Eulogio Bear, NP      PDMP not reviewed this encounter.   Eulogio Bear, NP 09/22/22 1700

## 2022-10-04 DIAGNOSIS — D485 Neoplasm of uncertain behavior of skin: Secondary | ICD-10-CM | POA: Diagnosis not present

## 2022-10-04 DIAGNOSIS — Z1283 Encounter for screening for malignant neoplasm of skin: Secondary | ICD-10-CM | POA: Diagnosis not present

## 2022-10-04 DIAGNOSIS — D2272 Melanocytic nevi of left lower limb, including hip: Secondary | ICD-10-CM | POA: Diagnosis not present

## 2022-10-04 DIAGNOSIS — D225 Melanocytic nevi of trunk: Secondary | ICD-10-CM | POA: Diagnosis not present

## 2022-10-14 DIAGNOSIS — D485 Neoplasm of uncertain behavior of skin: Secondary | ICD-10-CM | POA: Diagnosis not present

## 2022-10-14 DIAGNOSIS — L988 Other specified disorders of the skin and subcutaneous tissue: Secondary | ICD-10-CM | POA: Diagnosis not present

## 2022-11-03 DIAGNOSIS — M8580 Other specified disorders of bone density and structure, unspecified site: Secondary | ICD-10-CM | POA: Diagnosis not present

## 2022-11-03 DIAGNOSIS — I10 Essential (primary) hypertension: Secondary | ICD-10-CM | POA: Diagnosis not present

## 2023-01-09 ENCOUNTER — Other Ambulatory Visit: Payer: Self-pay | Admitting: *Deleted

## 2023-01-09 MED ORDER — ANASTROZOLE 1 MG PO TABS: 1.0000 mg | ORAL_TABLET | Freq: Every day | ORAL | 4 refills | Status: DC

## 2023-01-17 DIAGNOSIS — Z170 Estrogen receptor positive status [ER+]: Secondary | ICD-10-CM | POA: Diagnosis not present

## 2023-01-17 DIAGNOSIS — C50411 Malignant neoplasm of upper-outer quadrant of right female breast: Secondary | ICD-10-CM | POA: Diagnosis not present

## 2023-05-05 DIAGNOSIS — I10 Essential (primary) hypertension: Secondary | ICD-10-CM | POA: Diagnosis not present

## 2023-06-13 ENCOUNTER — Other Ambulatory Visit: Payer: Self-pay | Admitting: Hematology and Oncology

## 2023-06-13 DIAGNOSIS — Z9889 Other specified postprocedural states: Secondary | ICD-10-CM

## 2023-08-02 ENCOUNTER — Encounter: Payer: Self-pay | Admitting: Orthopaedic Surgery

## 2023-08-02 ENCOUNTER — Ambulatory Visit (INDEPENDENT_AMBULATORY_CARE_PROVIDER_SITE_OTHER): Payer: Medicare Other | Admitting: Orthopaedic Surgery

## 2023-08-02 ENCOUNTER — Other Ambulatory Visit (INDEPENDENT_AMBULATORY_CARE_PROVIDER_SITE_OTHER): Payer: Medicare Other

## 2023-08-02 VITALS — BP 99/74 | HR 111 | Ht 63.0 in | Wt 181.0 lb

## 2023-08-02 DIAGNOSIS — M79671 Pain in right foot: Secondary | ICD-10-CM | POA: Diagnosis not present

## 2023-08-02 DIAGNOSIS — M25561 Pain in right knee: Secondary | ICD-10-CM | POA: Diagnosis not present

## 2023-08-02 MED ORDER — METHYLPREDNISOLONE ACETATE 40 MG/ML IJ SUSP
40.0000 mg | Freq: Once | INTRAMUSCULAR | Status: AC
  Administered 2023-08-02: 40 mg via INTRA_ARTICULAR

## 2023-08-02 NOTE — Progress Notes (Signed)
Subjective:    Patient ID: [name], female    DOB: 1941-07-28, 82 y.o.   MRN: [id number]  HPI She has had pain in the right foot for about six weeks.  She was working in her flower bed and hurt her right foot. She also had an allergic reaction to some plant and had rash and swelling.  The rash has resolved. She has been limping some days more than others for several weeks.  Her foot is better but her right knee is hurting now medially.  She has swelling, popping and feeling it may give way but has not.  The knee is the problem today. She cannot take NSAIDs.  She has tried ice, heat, rest and Tylenol with only slight help.   Review of Systems  Constitutional:  Positive for activity change.  Musculoskeletal:  Positive for arthralgias, gait problem and joint swelling.  All other systems reviewed and are negative. For Review of Systems, all other systems reviewed and are negative.  The following is a summary of the past history medically, past history surgically, known current medicines, social history and family history.  This information is gathered electronically by the computer from prior information and documentation.  I review this each visit and have found including this information at this point in the chart is beneficial and informative.   Past Medical History:  Diagnosis Date   Anxiety    Breast cancer (HCC) 01/05/2021   12/2020 Ductal carcinoma right breast    Breast mass, left 08/24/2015   Family history of breast cancer    Family history of pancreatic cancer    Hypertension    Positive fecal occult blood test 02/20/2014   Will send 3 cards home    Past Surgical History:  Procedure Laterality Date   BREAST CYST EXCISION     BREAST EXCISIONAL BIOPSY Bilateral >10+ years ago   benign   BREAST LUMPECTOMY WITH RADIOACTIVE SEED AND SENTINEL LYMPH NODE BIOPSY Right 01/29/2021   Procedure: RIGHT BREAST LUMPECTOMY WITH RADIOACTIVE SEED AND SENTINEL LYMPH NODE BIOPSY;  Surgeon:  Griselda Miner, MD;  Location: [facility] OR;  Service: General;  Laterality: Right;  START TIME OF 7:30 AM FOR 90 MINUTES ROOM 1   BREAST SURGERY Left    partail mastectomy-benign   CATARACT EXTRACTION, BILATERAL     [place]   CHOLECYSTECTOMY N/A 11/29/2013   Procedure: LAPAROSCOPIC CHOLECYSTECTOMY;  Surgeon: Marlane Hatcher, MD;  Location: [facility];  Service: General;  Laterality: N/A;   COLONOSCOPY N/A 03/14/2014   Procedure: COLONOSCOPY;  Surgeon: Malissa Hippo, MD;  Location: [facility];  Service: Endoscopy;  Laterality: N/A;  1250   COLONOSCOPY N/A 12/20/2019   Procedure: COLONOSCOPY;  Surgeon: Malissa Hippo, MD;  Location: [facility];  Service: Endoscopy;  Laterality: N/A;  1020   EYE SURGERY     Bilateral cataracts   MASTECTOMY     Partial left breast   POLYPECTOMY  12/20/2019   Procedure: POLYPECTOMY;  Surgeon: Malissa Hippo, MD;  Location: [facility];  Service: Endoscopy;;   TONSILLECTOMY      Current Outpatient Medications on File Prior to Visit  Medication Sig Dispense Refill   anastrozole (ARIMIDEX) 1 MG tablet Take 1 tablet (1 mg total) by mouth daily. 90 tablet 4   Ascorbic Acid (VITAMIN C GUMMIES PO) Take 250 mg by mouth daily.     Cholecalciferol (VITAMIN D3 GUMMIES ADULT PO) Take 2,000 Units by mouth daily.  cholecalciferol (VITAMIN D3) 25 MCG (1000 UNIT) tablet Take 1 tablet (1,000 Units total) by mouth daily.     losartan (COZAAR) 50 MG tablet Take 1 tablet (50 mg total) by mouth daily.     No current facility-administered medications on file prior to visit.    Social History   Socioeconomic History   Marital status: Married    Spouse name: Not on file   Number of children: Not on file   Years of education: Not on file   Highest education level: Not on file  Occupational History   Not on file  Tobacco Use   Smoking status: Former    Current packs/day: 0.00    Average packs/day: 0.5 packs/day for 4.0 years (2.0 ttl pk-yrs)    Types: Cigarettes     Start date: 11/29/1963    Quit date: 11/29/1967    Years since quitting: 55.7   Smokeless tobacco: Never  Vaping Use   Vaping status: Never Used  Substance and Sexual Activity   Alcohol use: Yes    Alcohol/week: 5.0 standard drinks of alcohol    Types: 5 Glasses of wine per week    Comment: social   Drug use: No   Sexual activity: Not Currently    Birth control/protection: Post-menopausal  Other Topics Concern   Not on file  Social History Narrative   Not on file   Social Determinants of Health   Financial Resource Strain: Not on file  Food Insecurity: Not on file  Transportation Needs: Not on file  Physical Activity: Not on file  Stress: Not on file  Social Connections: Not on file  Intimate Partner Violence: Not At Risk (01/20/2021)   Humiliation, Afraid, Rape, and Kick questionnaire    Fear of Current or Ex-Partner: No    Emotionally Abused: No    Physically Abused: No    Sexually Abused: No    Family History  Problem Relation Age of Onset   Cancer Mother        lung   Cancer Father        lung   Hypertension Father    Heart disease Maternal Grandfather    Heart attack Maternal Grandfather    Heart disease Paternal Grandfather    Heart attack Paternal Grandfather    Cancer Other        breast dx 55   Arthritis Sister        rheumatoid   COPD Sister    Breast cancer Sister 33   Breast cancer Cousin        dx 61s   Pancreatic cancer Cousin        d. 50s    BP 99/74   Pulse (!) 111   Ht 5\' 3"  (1.6 m)   Wt 181 lb (82.1 kg)   BMI 32.06 kg/m   Body mass index is 32.06 kg/m.      Objective:   Physical Exam Vitals and nursing note reviewed. Exam conducted with a chaperone present.  Constitutional:      Appearance: She is well-developed.  HENT:     Head: Normocephalic and atraumatic.  Eyes:     Conjunctiva/sclera: Conjunctivae normal.     Pupils: Pupils are equal, round, and reactive to light.  Cardiovascular:     Rate and Rhythm: Normal rate  and regular rhythm.  Pulmonary:     Effort: Pulmonary effort is normal.  Abdominal:     Palpations: Abdomen is soft.  Musculoskeletal:     Cervical  back: Normal range of motion and neck supple.       Legs:       Feet:  Skin:    General: Skin is warm and dry.  Neurological:     Mental Status: She is alert and oriented to person, place, and time.     Cranial Nerves: No cranial nerve deficit.     Motor: No abnormal muscle tone.     Coordination: Coordination normal.     Deep Tendon Reflexes: Reflexes are normal and symmetric. Reflexes normal.  Psychiatric:        Behavior: Behavior normal.        Thought Content: Thought content normal.        Judgment: Judgment normal.    X-rays done of the right foot and right knee, reported separately.       Assessment & Plan:   Encounter Diagnoses  Name Primary?   Acute pain of right foot Yes   Acute pain of right knee    PROCEDURE NOTE:  The patient requests injections of the right knee , verbal consent was obtained.  The right knee was prepped appropriately after time out was performed.   Sterile technique was observed and injection of 1 cc of DepoMedrol 40mg  with several cc's of plain xylocaine. Anesthesia was provided by ethyl chloride and a 20-gauge needle was used to inject the knee area. The injection was tolerated well.  A band aid dressing was applied.  The patient was advised to apply ice later today and tomorrow to the injection sight as needed.  Her foot is better and she will see how that does.  I am concerned about possible medial meniscal tear.  I will see her in two weeks for re-evaluation.  Call if any problem.  Precautions discussed.  Electronically Signed Darreld Mclean, MD 9/18/20249:40 AM

## 2023-08-16 ENCOUNTER — Encounter: Payer: Self-pay | Admitting: Orthopaedic Surgery

## 2023-08-16 ENCOUNTER — Ambulatory Visit (INDEPENDENT_AMBULATORY_CARE_PROVIDER_SITE_OTHER): Payer: Medicare Other | Admitting: Orthopaedic Surgery

## 2023-08-16 DIAGNOSIS — M25561 Pain in right knee: Secondary | ICD-10-CM

## 2023-08-16 MED ORDER — MELOXICAM 7.5 MG PO TABS: ORAL_TABLET | ORAL | 2 refills | Status: AC

## 2023-08-16 NOTE — Progress Notes (Addendum)
My knee is hurting more.  She has more medial pain of the right knee.  The injection only helped a little.  She has feeling of giving way.  She has swelling and pain.    Right knee has good ROM but has medial pain, effusion, crepitus, positive medial McMurray. Gait is slight limp to the right.  NV intact.  Encounter Diagnosis  Name Primary?   Acute pain of right knee Yes   I will get MRI of the right knee.  I am concerned about a medial meniscus tear on the right knee.  She is going to [facility] for her granddaughter's wedding this weekend.  I will give Mobic 7.5 bid for the knee.  Return in three weeks.  Call if any problem.  Precautions discussed.  Electronically Signed Darreld Mclean, MD 10/2/20248:33 AM

## 2023-08-16 NOTE — Addendum Note (Signed)
Addended by: Earnstine Regal on: 08/16/2023 08:35 AM   Modules accepted: Orders

## 2023-08-16 NOTE — Patient Instructions (Signed)
Central scheduling [phone] 

## 2023-09-01 ENCOUNTER — Ambulatory Visit
Admission: RE | Admit: 2023-09-01 | Discharge: 2023-09-01 | Disposition: A | Discharge status: Home / Self Care | Payer: Medicare Other | Source: Ambulatory Visit | Attending: Hematology and Oncology

## 2023-09-01 ENCOUNTER — Ambulatory Visit
Admission: RE | Admit: 2023-09-01 | Discharge: 2023-09-01 | Disposition: A | Discharge status: Home / Self Care | Payer: Medicare Other | Source: Ambulatory Visit | Attending: Orthopaedic Surgery | Admitting: Orthopaedic Surgery

## 2023-09-01 DIAGNOSIS — Z9889 Other specified postprocedural states: Secondary | ICD-10-CM

## 2023-09-01 DIAGNOSIS — S82144A Nondisplaced bicondylar fracture of right tibia, initial encounter for closed fracture: Secondary | ICD-10-CM | POA: Diagnosis not present

## 2023-09-01 DIAGNOSIS — M25561 Pain in right knee: Secondary | ICD-10-CM | POA: Diagnosis not present

## 2023-09-01 DIAGNOSIS — Z853 Personal history of malignant neoplasm of breast: Secondary | ICD-10-CM | POA: Diagnosis not present

## 2023-09-01 DIAGNOSIS — M23321 Other meniscus derangements, posterior horn of medial meniscus, right knee: Secondary | ICD-10-CM | POA: Diagnosis not present

## 2023-09-06 ENCOUNTER — Ambulatory Visit: Payer: Medicare Other | Admitting: Orthopaedic Surgery

## 2023-09-07 ENCOUNTER — Other Ambulatory Visit: Payer: Medicare Other

## 2023-09-07 DIAGNOSIS — Z23 Encounter for immunization: Secondary | ICD-10-CM | POA: Diagnosis not present

## 2023-09-11 ENCOUNTER — Telehealth: Payer: Self-pay

## 2023-09-11 NOTE — Telephone Encounter (Signed)
[facility] left message for a return call on this patient. I called and spoke with Elnita Maxwell, so she could document we called back on the MRI reading.  MRI report has been read and is in chart.

## 2023-09-13 ENCOUNTER — Encounter: Payer: Self-pay | Admitting: Orthopaedic Surgery

## 2023-09-13 ENCOUNTER — Ambulatory Visit (INDEPENDENT_AMBULATORY_CARE_PROVIDER_SITE_OTHER): Payer: Medicare Other | Admitting: Orthopaedic Surgery

## 2023-09-13 DIAGNOSIS — G8929 Other chronic pain: Secondary | ICD-10-CM

## 2023-09-13 DIAGNOSIS — M1711 Unilateral primary osteoarthritis, right knee: Secondary | ICD-10-CM | POA: Diagnosis not present

## 2023-09-13 DIAGNOSIS — S82144A Nondisplaced bicondylar fracture of right tibia, initial encounter for closed fracture: Secondary | ICD-10-CM

## 2023-09-13 DIAGNOSIS — M23221 Derangement of posterior horn of medial meniscus due to old tear or injury, right knee: Secondary | ICD-10-CM | POA: Diagnosis not present

## 2023-09-13 NOTE — Progress Notes (Signed)
My knee is better.  She has had pain in the right knee but it is better.  She went to [place] for a wedding and did well.  She has no new trauma.  MRI of the knee reveals: IMPRESSION: 1. Nondisplaced, nondepressed subchondral fracture of the medial tibial plateau with severe surrounding bone marrow edema. 2. Radial tear of the posterior horn of the medial meniscus towards the meniscal root with peripheral meniscal extrusion and degeneration of the body. 3. Moderate partial-thickness cartilage loss of the superior half of the patellar apex. 4. High-grade partial-thickness cartilage loss of the medial femorotibial compartment.  I have explained the findings to her.  She has a "hairline" fracture and she is doing better now.  I have told her to be careful but continue normal activities as she is improving.  I want to see her in two weeks and get plain X-rays of the knee then.  As far as any surgery, I would recommend to hold off and observe how she does.  She has little pain and is better.  Right knee has full ROM, gait is normal, NV intact, she does have medial joint line tenderness and positive medial McMurray.  No distal edema.  Encounter Diagnoses  Name Primary?   Closed nondisplaced fracture of right tibial plateau, initial encounter Yes   Chronic pain of right knee    X-rays on return.  Call if any problem.  Precautions discussed.  Electronically Signed Darreld Mclean, MD 10/30/20248:21 AM

## 2023-09-18 ENCOUNTER — Inpatient Hospital Stay: Payer: Medicare Other | Attending: Hematology and Oncology | Admitting: Hematology and Oncology

## 2023-09-18 VITALS — BP 147/71 | HR 80 | Temp 98.1°F | Resp 16 | Wt 185.3 lb

## 2023-09-18 DIAGNOSIS — M810 Age-related osteoporosis without current pathological fracture: Secondary | ICD-10-CM | POA: Diagnosis not present

## 2023-09-18 DIAGNOSIS — Z170 Estrogen receptor positive status [ER+]: Secondary | ICD-10-CM | POA: Diagnosis not present

## 2023-09-18 DIAGNOSIS — Z79899 Other long term (current) drug therapy: Secondary | ICD-10-CM | POA: Diagnosis not present

## 2023-09-18 DIAGNOSIS — C50411 Malignant neoplasm of upper-outer quadrant of right female breast: Secondary | ICD-10-CM | POA: Insufficient documentation

## 2023-09-18 DIAGNOSIS — Z87891 Personal history of nicotine dependence: Secondary | ICD-10-CM | POA: Diagnosis not present

## 2023-09-18 DIAGNOSIS — Z79811 Long term (current) use of aromatase inhibitors: Secondary | ICD-10-CM | POA: Insufficient documentation

## 2023-09-18 NOTE — Progress Notes (Signed)
[facility]  [phone] [phone]     ID: [name] DOB: 05/19/1941  MR#: [id number]  ZHY#:[id number]  Patient Care Team: Aliene Beams, MD as PCP - General (Family Medicine) Adline Potter, NP as Nurse Practitioner (Obstetrics and Gynecology) Griselda Miner, MD as Consulting Physician (General Surgery) Magrinat, Valentino Hue, MD (Inactive) as Consulting Physician (Oncology) Malissa Hippo, MD (Inactive) as Consulting Physician (Gastroenterology) Register, Joice Lofts, PA-C as Physician Assistant (Dermatology) Dorothy Puffer, MD as Consulting Physician (Radiation Oncology) Rachel Moulds, MD OTHER MD:  CHIEF COMPLAINT: Estrogen receptor positive breast cancer  CURRENT TREATMENT: anastrozole  INTERVAL HISTORY:  [name] returns today for follow up of her estrogen receptor positive breast cancer.  She is currently taking anastrozole for adjuvant antiestrogen therapy.    Discussed the use of AI scribe software for clinical note transcription with the patient, who gave verbal consent to proceed.  History of Present Illness    The patient, with a history of osteoporosis and breast cancer, presents with a torn meniscus. She reports that the injury occurred while carrying buckets of water down steps to mop her basement. The patient has been managing the pain without medication for the past few weeks and reports that the knee is improving. She has not been doing physical therapy.  The patient has been taking anastrozole daily for her breast cancer and reports no side effects. She has been more sedentary recently due to the knee injury and a previous foot issue, which has led to weight gain.  She hasn't been able to exercise for osteoporosis. The patient does not drink milk but consumes other dairy products like cheese and yogurt for calcium.  The patient recently had a mammogram, which showed no issues. She prefers to have diagnostic mammograms for the  immediate feedback they provide.       COVID 19 VACCINATION STATUS: fully vaccinated Gala Murdoch), with booster 07/2020   HISTORY OF CURRENT ILLNESS: From the original intake note:  [name] had routine screening mammography on 05/11/2020 showing a possible abnormality in the right breast. She underwent right diagnostic mammography with tomography and right breast ultrasonography at [facility] on 05/15/2020 showing: breast density category B; probable benign 1.1 cm mass in right breast at 9 o'clock. A six-month follow up right breast ultrasound was recommended.  Repeat right breast ultrasound was performed on 01/01/2021 showing: mild interval increase in size and irregularity of previously demonstrated mass in right breast at 9 o'clock, now 1.3 cm; normal-appearing right axillary lymph nodes.  Accordingly on the same day, she proceeded to biopsy of the right breast area in question. The pathology from this procedure ([id number]) showed: ductal carcinoma, with differential including papillary carcinoma. Prognostic indicators significant for: estrogen receptor, 95% positive and progesterone receptor, 5% positive, both with strong staining intensity. Proliferation marker Ki67 at 15%. HER2 equivocal by immunohistochemistry (2+), but negative by fluorescent in situ hybridization with a signals ratio 1.38 and number per cell 2.   Cancer Staging  Malignant neoplasm of upper-outer quadrant of right breast in female, estrogen receptor positive (HCC) Staging form: Breast, AJCC 8th Edition - Clinical: cT1c, cN0, cM0, GX, ER+, PR+, HER2- - Signed by Lowella Dell, MD on 01/19/2021 - Pathologic stage from 01/29/2021: Stage IA (pT1b, pN0, cM0, G2, ER+, PR+, HER2-) - Signed by Loa Socks, NP on 02/17/2021 Stage prefix: Initial diagnosis Histologic grading system: 3 grade system   The patient's subsequent history is as detailed below.   PAST  MEDICAL HISTORY: Past Medical History:   Diagnosis Date   Anxiety    Breast cancer (HCC) 01/05/2021   12/2020 Ductal carcinoma right breast    Breast mass, left 08/24/2015   Family history of breast cancer    Family history of pancreatic cancer    Hypertension    Positive fecal occult blood test 02/20/2014   Will send 3 cards home    PAST SURGICAL HISTORY: Past Surgical History:  Procedure Laterality Date   BREAST CYST EXCISION     BREAST EXCISIONAL BIOPSY Bilateral >10+ years ago   benign   BREAST LUMPECTOMY WITH RADIOACTIVE SEED AND SENTINEL LYMPH NODE BIOPSY Right 01/29/2021   Procedure: RIGHT BREAST LUMPECTOMY WITH RADIOACTIVE SEED AND SENTINEL LYMPH NODE BIOPSY;  Surgeon: Griselda Miner, MD;  Location: [facility] OR;  Service: General;  Laterality: Right;  START TIME OF 7:30 AM FOR 90 MINUTES ROOM 1   BREAST SURGERY Left    partail mastectomy-benign   CATARACT EXTRACTION, BILATERAL     [place]   CHOLECYSTECTOMY N/A 11/29/2013   Procedure: LAPAROSCOPIC CHOLECYSTECTOMY;  Surgeon: Marlane Hatcher, MD;  Location: [facility] ORS;  Service: General;  Laterality: N/A;   COLONOSCOPY N/A 03/14/2014   Procedure: COLONOSCOPY;  Surgeon: Malissa Hippo, MD;  Location: [facility] ENDO SUITE;  Service: Endoscopy;  Laterality: N/A;  1250   COLONOSCOPY N/A 12/20/2019   Procedure: COLONOSCOPY;  Surgeon: Malissa Hippo, MD;  Location: [facility] SUITE;  Service: Endoscopy;  Laterality: N/A;  1020   EYE SURGERY     Bilateral cataracts   MASTECTOMY     Partial left breast   POLYPECTOMY  12/20/2019   Procedure: POLYPECTOMY;  Surgeon: Malissa Hippo, MD;  Location: [facility];  Service: Endoscopy;;   TONSILLECTOMY      FAMILY HISTORY: Family History  Problem Relation Age of Onset   Cancer Mother        lung   Cancer Father        lung   Hypertension Father    Heart disease Maternal Grandfather    Heart attack Maternal Grandfather    Heart disease Paternal Grandfather    Heart attack Paternal Grandfather    Cancer Other        breast dx 56    Arthritis Sister        rheumatoid   COPD Sister    Breast cancer Sister 74   Breast cancer Cousin        dx 32s   Pancreatic cancer Cousin        d. 20s  The patient's father died at age 31 from lung cancer in the setting o of tobacco abuse.  The patient's mother died at age 13 from the same.  The patient's mother had a niece with breast cancer in her 39s.  The patient herself had 2 sisters and 1 brother, and one of her sisters had breast cancer.  The patient herself has a niece (daughter from her sister who does not have breast cancer) with breast cancer diagnosed at age 59.  The patient also has 1 brother with no history of cancer   GYNECOLOGIC HISTORY:  No LMP recorded. Patient is postmenopausal. Menarche: 82 years old GX P 0 LMP age 65 Contraceptive HRT no Hysterectomy? no BSO? 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  Her husband Gabriel Rung used to be a Medical sales representative.  Their 2 (adopted) children are Durene Cal lives near [place] and sells surgical equipment and Nicki Guadalajara who is a Runner, broadcasting/film/video in [place].  The patient has 5 grandchildren.  She is expecting her great grandchild in the next few months.  She is a International aid/development worker.    ADVANCED DIRECTIVES: In the absence of any documentation to the contrary, the patient's spouse is their HCPOA.    HEALTH MAINTENANCE: Social History   Tobacco Use   Smoking status: Former    Current packs/day: 0.00    Average packs/day: 0.5 packs/day for 4.0 years (2.0 ttl pk-yrs)    Types: Cigarettes    Start date: 11/29/1963    Quit date: 11/29/1967    Years since quitting: 55.8   Smokeless tobacco: Never  Vaping Use   Vaping status: Never Used  Substance Use Topics   Alcohol use: Yes    Alcohol/week: 5.0 standard drinks of alcohol    Types: 5 Glasses of wine per week    Comment: social   Drug use: No     Colonoscopy: 12/2019 (Dr. Karilyn Cota), recall  2026  PAP: 02/2014, negative; repeat not indicated  Bone density: 2010?   No Known Allergies  Current Outpatient Medications  Medication Sig Dispense Refill   anastrozole (ARIMIDEX) 1 MG tablet Take 1 tablet (1 mg total) by mouth daily. 90 tablet 4   Ascorbic Acid (VITAMIN C GUMMIES PO) Take 250 mg by mouth daily.     Cholecalciferol (VITAMIN D3 GUMMIES ADULT PO) Take 2,000 Units by mouth daily.     cholecalciferol (VITAMIN D3) 25 MCG (1000 UNIT) tablet Take 1 tablet (1,000 Units total) by mouth daily.     losartan-hydrochlorothiazide (HYZAAR) 100-25 MG tablet Take 1 tablet by mouth daily.     meloxicam (MOBIC) 7.5 MG tablet Take one tablet twice a day after eating. 60 tablet 2   No current facility-administered medications for this visit.    OBJECTIVE: White woman who appears younger than stated age  Vitals:   09/18/23 0903  BP: (!) 147/71  Pulse: 80  Resp: 16  Temp: 98.1 F (36.7 C)  SpO2: 98%       Body mass index is 32.82 kg/m.   Wt Readings from Last 3 Encounters:  09/18/23 185 lb 4.8 oz (84.1 kg)  08/02/23 181 lb (82.1 kg)  09/15/22 179 lb (81.2 kg)     ECOG FS:1 - Symptomatic but completely ambulatory  Physical Exam Constitutional:      Appearance: Normal appearance.  Chest:     Comments: Bilateral breasts inspected.  No palpable masses or regional adenopathy. Musculoskeletal:     Cervical back: Normal range of motion and neck supple. No rigidity.  Lymphadenopathy:     Cervical: No cervical adenopathy.  Neurological:     Mental Status: She is alert.       LAB RESULTS:  CMP     Component Value Date/Time   NA 142 09/14/2021 1342   K 3.9 09/14/2021 1342   CL 110 09/14/2021 1342   CO2 25 09/14/2021 1342   GLUCOSE 95 09/14/2021 1342   BUN 20 09/14/2021 1342   CREATININE 0.74 09/14/2021 1342   CREATININE 0.84 01/19/2021 1514   CREATININE 0.63 02/20/2014 1011   CALCIUM 9.3 09/14/2021 1342   PROT 6.8 09/14/2021 1342   ALBUMIN 4.0 09/14/2021 1342    AST 10 (L) 09/14/2021 1342   AST 11 (L) 01/19/2021 1514   ALT 15 09/14/2021 1342   ALT 16 01/19/2021 1514   ALKPHOS 75 09/14/2021  1342   BILITOT 0.5 09/14/2021 1342   BILITOT 0.6 01/19/2021 1514   GFRNONAA >60 09/14/2021 1342   GFRNONAA >60 01/19/2021 1514   GFRAA >90 11/30/2013 0634    No results found for: "TOTALPROTELP", "ALBUMINELP", "A1GS", "A2GS", "BETS", "BETA2SER", "GAMS", "MSPIKE", "SPEI"  Lab Results  Component Value Date   WBC 5.6 09/14/2021   NEUTROABS 3.6 09/14/2021   HGB 14.0 09/14/2021   HCT 41.7 09/14/2021   MCV 91.0 09/14/2021   PLT 215 09/14/2021    No results found for: "LABCA2"  No components found for: "ZOXWRU045"  No results for input(s): "INR" in the last 168 hours.  No results found for: "LABCA2"  No results found for: "WUJ811"  No results found for: "CAN125"  No results found for: "CAN153"  No results found for: "CA2729"  No components found for: "HGQUANT"  No results found for: "CEA1", "CEA" / No results found for: "CEA1", "CEA"   No results found for: "AFPTUMOR"  No results found for: "CHROMOGRNA"  No results found for: "KPAFRELGTCHN", "LAMBDASER", "KAPLAMBRATIO" (kappa/lambda light chains)  No results found for: "HGBA", "HGBA2QUANT", "HGBFQUANT", "HGBSQUAN" (Hemoglobinopathy evaluation)   No results found for: "LDH"  No results found for: "IRON", "TIBC", "IRONPCTSAT" (Iron and TIBC)  No results found for: "FERRITIN"  Urinalysis No results found for: "COLORURINE", "APPEARANCEUR", "LABSPEC", "PHURINE", "GLUCOSEU", "HGBUR", "BILIRUBINUR", "KETONESUR", "PROTEINUR", "UROBILINOGEN", "NITRITE", "LEUKOCYTESUR"   STUDIES: MR Knee Right Wo Contrast  Result Date: 09/11/2023 CLINICAL DATA:  Anterior knee pain and weakness for 4 weeks EXAM: MRI OF THE RIGHT KNEE WITHOUT CONTRAST TECHNIQUE: Multiplanar, multisequence MR imaging of the knee was performed. No intravenous contrast was administered. COMPARISON:  None Available. FINDINGS:  MENISCI Medial: Radial tear of the posterior horn of the medial meniscus towards the meniscal root with peripheral meniscal extrusion and degeneration of the body. Lateral: Intact. LIGAMENTS Cruciates: ACL and PCL are intact. Collaterals: Medial collateral ligament is intact. Lateral collateral ligament complex is intact. CARTILAGE Patellofemoral: Moderate partial-thickness cartilage loss of the superior half of the patellar apex. Medial: High-grade partial-thickness cartilage loss of the medial femorotibial compartment. Lateral:  No chondral defect. JOINT: No joint effusion. Normal Hoffa's fat-pad. No plical thickening. POPLITEAL FOSSA: Popliteus tendon is intact. No Baker's cyst. EXTENSOR MECHANISM: Intact quadriceps tendon. Intact patellar tendon. Intact lateral patellar retinaculum. Intact medial patellar retinaculum. Intact MPFL. BONES: No aggressive osseous lesion. Nondisplaced, nondepressed subchondral fracture of the medial tibial plateau with severe surrounding bone marrow edema. Mild soft tissue edema along the anteromedial tibial metaphysis. Other: No fluid collection or hematoma. Muscles are normal. IMPRESSION: 1. Nondisplaced, nondepressed subchondral fracture of the medial tibial plateau with severe surrounding bone marrow edema. 2. Radial tear of the posterior horn of the medial meniscus towards the meniscal root with peripheral meniscal extrusion and degeneration of the body. 3. Moderate partial-thickness cartilage loss of the superior half of the patellar apex. 4. High-grade partial-thickness cartilage loss of the medial femorotibial compartment. Electronically Signed   By: Elige Ko M.D.   On: 09/11/2023 06:46   MM 3D DIAGNOSTIC MAMMOGRAM BILATERAL BREAST  Result Date: 09/01/2023 CLINICAL DATA:  82 year old female presenting for annual exam. History of right breast cancer status post lumpectomy in 2022. No new problems. EXAM: DIGITAL DIAGNOSTIC BILATERAL MAMMOGRAM WITH TOMOSYNTHESIS AND CAD  TECHNIQUE: Bilateral digital diagnostic mammography and breast tomosynthesis was performed. The images were evaluated with computer-aided detection. COMPARISON:  Previous exam(s). ACR Breast Density Category b: There are scattered areas of fibroglandular density. FINDINGS: Right breast: A spot 2D magnification  view of the lumpectomy site was performed in addition to standard views. There are stable postsurgical changes. No suspicious mass, distortion, or microcalcifications are identified to suggest presence of malignancy. Left breast: No suspicious mass, distortion, or microcalcifications are identified to suggest presence of malignancy. IMPRESSION: Stable postsurgical changes in the right breast. No mammographic evidence of malignancy bilaterally. RECOMMENDATION: Diagnostic bilateral mammogram in 1 year. I have discussed the findings and recommendations with the patient. If applicable, a reminder letter will be sent to the patient regarding the next appointment. BI-RADS CATEGORY  2: Benign. Electronically Signed   By: Emmaline Kluver M.D.   On: 09/01/2023 11:17     ELIGIBLE FOR AVAILABLE RESEARCH PROTOCOL: no  ASSESSMENT: 82 y.o. 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, estrogen and progesterone receptor positive, HER-2 not amplified, with an MIB-1 of 15%  (1) s/p right lumpectomy and sentinel node sampling 01/29/2021 for a pT1b pN0, stage IA mucinous invasive ductal carcinoma, grade 2, with negative margins  (2) adjuvant radiation4/28/2022 through 04/07/2021 Site Technique Total Dose (Gy) Dose per Fx (Gy) Completed Fx Beam Energies  Breast, Right: Breast_Rt 3D 42.56/42.56 2.66 16/16 6X  Breast, Right: Breast_Rt_Bst specialPort 8/8 2 4/4 15E    (3) patient decided against genetics testing  (4) anastrozole started 05/03/2021  (A) bone scan 11/19/2021   PLAN:  Patient is here for  follow-up on anastrozole.    Breast Cancer On Anastrozole with no reported side effects. Recent mammogram was satisfactory. -Continue Anastrozole as prescribed. -Next mammogram ordered as diagnostic per pt preference.  Osteoporosis Sedentary lifestyle due to knee injury. Discussed the importance of exercise and the risk of worsening osteoporosis due to Anastrozole. Discussed the benefits and side effects of Fosamax. -Contact dentist for dental clearance prior to starting Fosamax. Discussed side effects of osteonecrosis and hence dental clearance. -Continue Vitamin D supplementation. -Encourage intake of calcium-rich foods (yogurt, cheese).  General Health Maintenance -Encourage resumption of exercise as knee injury allows. -Refill Anastrozole as needed.  Total time spent: 30 min.  *Total Encounter Time as defined by the Centers for Medicare and Medicaid Services includes, in addition to the face-to-face time of a patient visit (documented in the note above) non-face-to-face time: obtaining and reviewing outside history, ordering and reviewing medications, tests or procedures, care coordination (communications with other health care professionals or caregivers) and documentation in the medical record.

## 2023-09-19 ENCOUNTER — Ambulatory Visit: Payer: Medicare Other | Admitting: Orthopaedic Surgery

## 2023-09-26 ENCOUNTER — Ambulatory Visit: Payer: Medicare Other | Admitting: Orthopaedic Surgery

## 2023-09-27 DIAGNOSIS — Z0000 Encounter for general adult medical examination without abnormal findings: Secondary | ICD-10-CM | POA: Diagnosis not present

## 2023-09-27 DIAGNOSIS — Z13220 Encounter for screening for lipoid disorders: Secondary | ICD-10-CM | POA: Diagnosis not present

## 2023-09-27 DIAGNOSIS — Z23 Encounter for immunization: Secondary | ICD-10-CM | POA: Diagnosis not present

## 2023-09-27 DIAGNOSIS — M8589 Other specified disorders of bone density and structure, multiple sites: Secondary | ICD-10-CM | POA: Diagnosis not present

## 2023-09-27 DIAGNOSIS — I10 Essential (primary) hypertension: Secondary | ICD-10-CM | POA: Diagnosis not present

## 2023-09-28 ENCOUNTER — Encounter: Payer: Self-pay | Admitting: Orthopaedic Surgery

## 2023-09-28 ENCOUNTER — Ambulatory Visit: Payer: Medicare Other | Admitting: Orthopaedic Surgery

## 2023-09-28 ENCOUNTER — Other Ambulatory Visit (INDEPENDENT_AMBULATORY_CARE_PROVIDER_SITE_OTHER): Payer: Self-pay

## 2023-09-28 DIAGNOSIS — S82144A Nondisplaced bicondylar fracture of right tibia, initial encounter for closed fracture: Secondary | ICD-10-CM

## 2023-09-28 DIAGNOSIS — S82144D Nondisplaced bicondylar fracture of right tibia, subsequent encounter for closed fracture with routine healing: Secondary | ICD-10-CM

## 2023-09-28 NOTE — Progress Notes (Signed)
I have no pain.  She is doing well with the right knee.  She has no pain.  She is walking well. ROM is full.  X-rays were done of the right knee, reported separately.  Encounter Diagnosis  Name Primary?   Closed nondisplaced fracture of right tibial plateau with routine healing, subsequent encounter Yes   Return in one month.  X-rays of the right knee.  Call if any problem.  Precautions discussed.  Marland Kitchenwkx

## 2023-10-26 ENCOUNTER — Ambulatory Visit: Payer: Medicare Other | Admitting: Orthopaedic Surgery

## 2023-10-26 ENCOUNTER — Other Ambulatory Visit (INDEPENDENT_AMBULATORY_CARE_PROVIDER_SITE_OTHER): Payer: Medicare Other

## 2023-10-26 ENCOUNTER — Encounter: Payer: Self-pay | Admitting: Orthopaedic Surgery

## 2023-10-26 VITALS — BP 119/76 | HR 103

## 2023-10-26 DIAGNOSIS — M25561 Pain in right knee: Secondary | ICD-10-CM

## 2023-10-26 DIAGNOSIS — G8929 Other chronic pain: Secondary | ICD-10-CM

## 2023-10-26 NOTE — Progress Notes (Signed)
My knee does not hurt.  She has full ROM of the right knee and no pain.  She is walking well.  X-rays were done of the right knee, reported separately.  Negative.  She has no changes on X-rays of knee.  Encounter Diagnosis  Name Primary?   Chronic pain of right knee Yes   I will see prn.  Call if any problem.  Precautions discussed.  Electronically Signed Darreld Mclean, MD 12/12/20248:56 AM

## 2024-01-10 ENCOUNTER — Other Ambulatory Visit: Payer: Self-pay | Admitting: Hematology and Oncology

## 2024-01-17 DIAGNOSIS — Z170 Estrogen receptor positive status [ER+]: Secondary | ICD-10-CM | POA: Diagnosis not present

## 2024-01-17 DIAGNOSIS — C50411 Malignant neoplasm of upper-outer quadrant of right female breast: Secondary | ICD-10-CM | POA: Diagnosis not present

## 2024-04-03 DIAGNOSIS — I10 Essential (primary) hypertension: Secondary | ICD-10-CM | POA: Diagnosis not present

## 2024-04-03 DIAGNOSIS — M8580 Other specified disorders of bone density and structure, unspecified site: Secondary | ICD-10-CM | POA: Diagnosis not present

## 2024-04-03 DIAGNOSIS — F419 Anxiety disorder, unspecified: Secondary | ICD-10-CM | POA: Diagnosis not present

## 2024-07-01 DIAGNOSIS — D2271 Melanocytic nevi of right lower limb, including hip: Secondary | ICD-10-CM | POA: Diagnosis not present

## 2024-07-01 DIAGNOSIS — D2262 Melanocytic nevi of left upper limb, including shoulder: Secondary | ICD-10-CM | POA: Diagnosis not present

## 2024-07-01 DIAGNOSIS — L821 Other seborrheic keratosis: Secondary | ICD-10-CM | POA: Diagnosis not present

## 2024-07-01 DIAGNOSIS — D1801 Hemangioma of skin and subcutaneous tissue: Secondary | ICD-10-CM | POA: Diagnosis not present

## 2024-07-01 DIAGNOSIS — D485 Neoplasm of uncertain behavior of skin: Secondary | ICD-10-CM | POA: Diagnosis not present

## 2024-07-01 DIAGNOSIS — L814 Other melanin hyperpigmentation: Secondary | ICD-10-CM | POA: Diagnosis not present

## 2024-07-01 DIAGNOSIS — L570 Actinic keratosis: Secondary | ICD-10-CM | POA: Diagnosis not present

## 2024-07-01 DIAGNOSIS — C44319 Basal cell carcinoma of skin of other parts of face: Secondary | ICD-10-CM | POA: Diagnosis not present

## 2024-07-01 DIAGNOSIS — D2272 Melanocytic nevi of left lower limb, including hip: Secondary | ICD-10-CM | POA: Diagnosis not present

## 2024-07-01 DIAGNOSIS — D225 Melanocytic nevi of trunk: Secondary | ICD-10-CM | POA: Diagnosis not present

## 2024-07-01 DIAGNOSIS — D2261 Melanocytic nevi of right upper limb, including shoulder: Secondary | ICD-10-CM | POA: Diagnosis not present

## 2024-07-01 DIAGNOSIS — C44519 Basal cell carcinoma of skin of other part of trunk: Secondary | ICD-10-CM | POA: Diagnosis not present

## 2024-07-01 DIAGNOSIS — L720 Epidermal cyst: Secondary | ICD-10-CM | POA: Diagnosis not present

## 2024-07-05 ENCOUNTER — Encounter: Payer: Self-pay | Admitting: Radiology

## 2024-09-02 ENCOUNTER — Other Ambulatory Visit: Payer: Self-pay | Admitting: Hematology and Oncology

## 2024-09-02 ENCOUNTER — Ambulatory Visit
Admission: RE | Admit: 2024-09-02 | Discharge: 2024-09-02 | Disposition: A | Discharge status: Home / Self Care | Source: Ambulatory Visit | Attending: Hematology and Oncology | Admitting: Hematology and Oncology

## 2024-09-02 DIAGNOSIS — C50411 Malignant neoplasm of upper-outer quadrant of right female breast: Secondary | ICD-10-CM

## 2024-09-02 DIAGNOSIS — R928 Other abnormal and inconclusive findings on diagnostic imaging of breast: Secondary | ICD-10-CM | POA: Diagnosis not present

## 2024-09-02 DIAGNOSIS — R921 Mammographic calcification found on diagnostic imaging of breast: Secondary | ICD-10-CM

## 2024-09-02 HISTORY — DX: Personal history of irradiation: Z92.3

## 2024-09-04 ENCOUNTER — Ambulatory Visit
Admission: RE | Admit: 2024-09-04 | Discharge: 2024-09-04 | Disposition: A | Source: Ambulatory Visit | Attending: Hematology and Oncology

## 2024-09-04 ENCOUNTER — Ambulatory Visit
Admission: RE | Admit: 2024-09-04 | Discharge: 2024-09-04 | Disposition: A | Discharge status: Home / Self Care | Source: Ambulatory Visit | Attending: Hematology and Oncology | Admitting: Hematology and Oncology

## 2024-09-04 DIAGNOSIS — R921 Mammographic calcification found on diagnostic imaging of breast: Secondary | ICD-10-CM

## 2024-09-04 DIAGNOSIS — N6031 Fibrosclerosis of right breast: Secondary | ICD-10-CM | POA: Diagnosis not present

## 2024-09-04 HISTORY — PX: BREAST BIOPSY: SHX20

## 2024-09-05 LAB — SURGICAL PATHOLOGY

## 2024-09-16 ENCOUNTER — Encounter: Payer: Self-pay | Admitting: Radiology

## 2024-09-18 ENCOUNTER — Telehealth: Payer: Self-pay

## 2024-09-18 NOTE — Telephone Encounter (Signed)
 Spoke with patient and confirmed appointment on 11/5

## 2024-09-19 ENCOUNTER — Inpatient Hospital Stay: Payer: Medicare Other | Attending: Hematology and Oncology | Admitting: Hematology and Oncology

## 2024-09-19 VITALS — BP 135/68 | HR 77 | Temp 98.0°F | Resp 16 | Wt 185.8 lb

## 2024-09-19 DIAGNOSIS — Z800 Family history of malignant neoplasm of digestive organs: Secondary | ICD-10-CM | POA: Insufficient documentation

## 2024-09-19 DIAGNOSIS — Z923 Personal history of irradiation: Secondary | ICD-10-CM | POA: Insufficient documentation

## 2024-09-19 DIAGNOSIS — M810 Age-related osteoporosis without current pathological fracture: Secondary | ICD-10-CM | POA: Insufficient documentation

## 2024-09-19 DIAGNOSIS — Z1721 Progesterone receptor positive status: Secondary | ICD-10-CM | POA: Diagnosis not present

## 2024-09-19 DIAGNOSIS — Z79899 Other long term (current) drug therapy: Secondary | ICD-10-CM | POA: Insufficient documentation

## 2024-09-19 DIAGNOSIS — C50411 Malignant neoplasm of upper-outer quadrant of right female breast: Secondary | ICD-10-CM | POA: Diagnosis not present

## 2024-09-19 DIAGNOSIS — Z87891 Personal history of nicotine dependence: Secondary | ICD-10-CM | POA: Diagnosis not present

## 2024-09-19 DIAGNOSIS — Z79811 Long term (current) use of aromatase inhibitors: Secondary | ICD-10-CM | POA: Insufficient documentation

## 2024-09-19 DIAGNOSIS — Z801 Family history of malignant neoplasm of trachea, bronchus and lung: Secondary | ICD-10-CM | POA: Insufficient documentation

## 2024-09-19 DIAGNOSIS — Z1732 Human epidermal growth factor receptor 2 negative status: Secondary | ICD-10-CM | POA: Diagnosis not present

## 2024-09-19 DIAGNOSIS — Z803 Family history of malignant neoplasm of breast: Secondary | ICD-10-CM | POA: Insufficient documentation

## 2024-09-19 DIAGNOSIS — Z170 Estrogen receptor positive status [ER+]: Secondary | ICD-10-CM | POA: Diagnosis not present

## 2024-09-19 MED ORDER — ANASTROZOLE 1 MG PO TABS: 1.0000 mg | ORAL_TABLET | Freq: Every day | ORAL | 4 refills | Status: AC

## 2024-09-19 NOTE — Progress Notes (Signed)
 [name]  Telephone:([phone]) [phone] Fax:([phone]) [phone]     ID: [name] DOB: August 17, 1941  MR#: [id number]  RDW#:[id number]  Patient Care Team: Rolinda Millman, MD as PCP - General (Family Medicine) Signa Delon LABOR, NP as Nurse Practitioner (Obstetrics and Gynecology) Curvin Deward MOULD, MD as Consulting Physician (General Surgery) Rehman, Claudis PENNER, MD (Inactive) as Consulting Physician (Gastroenterology) Register, Jeoffrey, PA-C as Physician Assistant (Dermatology) Dewey Rush, MD as Consulting Physician (Radiation Oncology) Amber Stalls, MD   CHIEF COMPLAINT: Estrogen receptor positive breast cancer  CURRENT TREATMENT: anastrozole   INTERVAL HISTORY:  Discussed the use of AI scribe software for clinical note transcription with the patient, who gave verbal consent to proceed.  History of Present Illness [name] is an 83 year old female with breast cancer who presents for a follow-up visit.  She has been on anastrozole  since June 2022 for breast cancer without significant side effects. A recent breast biopsy was performed due to calcifications seen on a mammogram. This showed fat necrosis.  She has osteoporosis and previously tried Fosamax but discontinued it due to intolerance. Calcium supplements cause constipation, so she avoids them. She uses a recumbent bicycle for exercise, although it is not weight-bearing.  She has a history of a torn meniscus, which was managed conservatively without surgery. The knee is now strong, and she avoids activities that previously exacerbated the condition, such as mopping the basement.  Her medications are filled at the [facility] in [place].   COVID 19 VACCINATION STATUS: fully vaccinated (Moderna), with booster 07/2020   HISTORY OF CURRENT ILLNESS: From the original intake note:  [name] had routine screening mammography on 05/11/2020 showing a possible abnormality in the right breast. She underwent  right diagnostic mammography with tomography and right breast ultrasonography at [facility] on 05/15/2020 showing: breast density category B; probable benign 1.1 cm mass in right breast at 9 o'clock. A six-month follow up right breast ultrasound was recommended.  Repeat right breast ultrasound was performed on 01/01/2021 showing: mild interval increase in size and irregularity of previously demonstrated mass in right breast at 9 o'clock, now 1.3 cm; normal-appearing right axillary lymph nodes.  Accordingly on the same day, she proceeded to biopsy of the right breast area in question. The pathology from this procedure ([id number]) showed: ductal carcinoma, with differential including papillary carcinoma. Prognostic indicators significant for: estrogen receptor, 95% positive and progesterone receptor, 5% positive, both with strong staining intensity. Proliferation marker Ki67 at 15%. HER2 equivocal by immunohistochemistry (2+), but negative by fluorescent in situ hybridization with a signals ratio 1.38 and number per cell 2.   Cancer Staging  Malignant neoplasm of upper-outer quadrant of right breast in female, estrogen receptor positive (HCC) Staging form: Breast, AJCC 8th Edition - Clinical: cT1c, cN0, cM0, GX, ER+, PR+, HER2- - Signed by Layla Sandria BROCKS, MD on 01/19/2021 - Pathologic stage from 01/29/2021: Stage IA (pT1b, pN0, cM0, G2, ER+, PR+, HER2-) - Signed by Crawford Morna Pickle, NP on 02/17/2021 Stage prefix: Initial diagnosis Histologic grading system: 3 grade system   The patient's subsequent history is as detailed below.   PAST MEDICAL HISTORY: Past Medical History:  Diagnosis Date   Anxiety    Breast cancer (HCC) 01/05/2021   12/2020 Ductal carcinoma right breast    Breast mass, left 08/24/2015   Family history of breast cancer    Family history of pancreatic cancer    Hypertension    Personal history of radiation therapy  Positive fecal occult blood test 02/20/2014    Will send 3 cards home    PAST SURGICAL HISTORY: Past Surgical History:  Procedure Laterality Date   BREAST BIOPSY Right 09/04/2024   MM RT BREAST BX W LOC DEV 1ST LESION IMAGE BX SPEC STEREO GUIDE 09/04/2024 [facility] MAMMOGRAP[facility]   BREAST CYST EXCISION     BREAST EXCISIONAL BIOPSY Bilateral >10+ years ago   benign   BREAST LUMPECTOMY Right    BREAST LUMPECTOMY WITH RADIOACTIVE SEED AND SENTINEL LYMPH NODE BIOPSY Right 01/29/2021   Procedure: RIGHT BREAST LUMPECTOMY WITH RADIOACTIVE SEED AND SENTINEL LYMPH NODE BIOPSY;  Surgeon: Curvin Deward MOULD, MD;  Location: [facility] OR;  Service: General;  Laterality: Right;  START TIME OF 7:30 AM FOR 90 MINUTES ROOM 1   BREAST SURGERY Left    partail mastectomy-benign   CATARACT EXTRACTION, BILATERAL     [place]   CHOLECYSTECTOMY N/A 11/29/2013   Procedure: LAPAROSCOPIC CHOLECYSTECTOMY;  Surgeon: Elsie GORMAN Holland, MD;  Location: [facility] ORS;  Service: General;  Laterality: N/A;   COLONOSCOPY N/A 03/14/2014   Procedure: COLONOSCOPY;  Surgeon: Claudis RAYMOND Rivet, MD;  Location: [facility] END[facility] SUITE;  Service: Endoscopy;  Laterality: N/A;  1250   COLONOSCOPY N/A 12/20/2019   Procedure: COLONOSCOPY;  Surgeon: Rivet Claudis RAYMOND, MD;  Location: [facility];  Service: Endoscopy;  Laterality: N/A;  1020   EYE SURGERY     Bilateral cataracts   POLYPECTOMY  12/20/2019   Procedure: POLYPECTOMY;  Surgeon: Rivet Claudis RAYMOND, MD;  Location: [facility];  Service: Endoscopy;;   TONSILLECTOMY      FAMILY HISTORY: Family History  Problem Relation Age of Onset   Cancer Mother        lung   Cancer Father        lung   Hypertension Father    Heart disease Maternal Grandfather    Heart attack Maternal Grandfather    Heart disease Paternal Grandfather    Heart attack Paternal Grandfather    Cancer Other        breast dx 56   Arthritis Sister        rheumatoid   COPD Sister    Breast cancer Sister 85   Breast cancer Cousin        dx 21s   Pancreatic cancer Cousin         d. 97s  The patient's father died at age 24 from lung cancer in the setting o of tobacco abuse.  The patient's mother died at age 46 from the same.  The patient's mother had a niece with breast cancer in her 70s.  The patient herself had 2 sisters and 1 brother, and one of her sisters had breast cancer.  The patient herself has a niece (daughter from her sister who does not have breast cancer) with breast cancer diagnosed at age 16.  The patient also has 1 brother with no history of cancer   GYNECOLOGIC HISTORY:  No LMP recorded. Patient is postmenopausal. Menarche: 83 years old GX P 0 LMP age 73 Contraceptive HRT no Hysterectomy? no BSO? 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  Her husband Larnell used to be a medical sales representative.  Their 2 (adopted) children are Katrinka lives near [place] and sells surgical equipment and Lolita who is a runner, broadcasting/film/video in [place].  The patient has 5 grandchildren.  She is expecting her great  grandchild in the next few months.  She is a International Aid/development Worker.    ADVANCED DIRECTIVES: In the absence of any documentation to the contrary, the patient's spouse is their HCPOA.    HEALTH MAINTENANCE: Social History   Tobacco Use   Smoking status: Former    Current packs/day: 0.00    Average packs/day: 0.5 packs/day for 4.0 years (2.0 ttl pk-yrs)    Types: Cigarettes    Start date: 11/29/1963    Quit date: 11/29/1967    Years since quitting: 56.8   Smokeless tobacco: Never  Vaping Use   Vaping status: Never Used  Substance Use Topics   Alcohol use: Yes    Alcohol/week: 5.0 standard drinks of alcohol    Types: 5 Glasses of wine per week    Comment: social   Drug use: No     Colonoscopy: 12/2019 (Dr. Golda), recall 2026  PAP: 02/2014, negative; repeat not indicated  Bone density: 2010?   No Known Allergies  Current Outpatient Medications  Medication Sig Dispense Refill    anastrozole  (ARIMIDEX ) 1 MG tablet Take 1 tablet (1 mg total) by mouth daily. 90 tablet 4   Ascorbic Acid (VITAMIN C GUMMIES PO) Take 250 mg by mouth daily.     Cholecalciferol (VITAMIN D3 GUMMIES ADULT PO) Take 2,000 Units by mouth daily.     cholecalciferol (VITAMIN D3) 25 MCG (1000 UNIT) tablet Take 1 tablet (1,000 Units total) by mouth daily.     losartan-hydrochlorothiazide (HYZAAR) 100-25 MG tablet Take 1 tablet by mouth daily.     meloxicam  (MOBIC ) 7.5 MG tablet Take one tablet twice a day after eating. 60 tablet 2   No current facility-administered medications for this visit.    OBJECTIVE: White woman who appears younger than stated age  There were no vitals filed for this visit.      There is no height or weight on file to calculate BMI.   Wt Readings from Last 3 Encounters:  09/18/23 185 lb 4.8 oz (84.1 kg)  08/02/23 181 lb (82.1 kg)  09/15/22 179 lb (81.2 kg)     ECOG FS:1 - Symptomatic but completely ambulatory  Physical Exam Constitutional:      Appearance: Normal appearance.  Chest:     Comments: Bilateral breasts inspected.  No palpable masses or regional adenopathy. Musculoskeletal:     Cervical back: Normal range of motion and neck supple. No rigidity.  Lymphadenopathy:     Cervical: No cervical adenopathy.  Neurological:     Mental Status: She is alert.       LAB RESULTS:  CMP     Component Value Date/Time   NA 142 09/14/2021 1342   K 3.9 09/14/2021 1342   CL 110 09/14/2021 1342   CO2 25 09/14/2021 1342   GLUCOSE 95 09/14/2021 1342   BUN 20 09/14/2021 1342   CREATININE 0.74 09/14/2021 1342   CREATININE 0.84 01/19/2021 1514   CREATININE 0.63 02/20/2014 1011   CALCIUM 9.3 09/14/2021 1342   PROT 6.8 09/14/2021 1342   ALBUMIN 4.0 09/14/2021 1342   AST 10 (L) 09/14/2021 1342   AST 11 (L) 01/19/2021 1514   ALT 15 09/14/2021 1342   ALT 16 01/19/2021 1514   ALKPHOS 75 09/14/2021 1342   BILITOT 0.5 09/14/2021 1342   BILITOT 0.6 01/19/2021 1514    GFRNONAA >60 09/14/2021 1342   GFRNONAA >60 01/19/2021 1514   GFRAA >90 11/30/2013 0634    No results found for: TOTALPROTELP, ALBUMINELP, A1GS, A2GS, BETS, BETA2SER, GAMS,  MSPIKE, SPEI  Lab Results  Component Value Date   WBC 5.6 09/14/2021   NEUTROABS 3.6 09/14/2021   HGB 14.0 09/14/2021   HCT 41.7 09/14/2021   MCV 91.0 09/14/2021   PLT 215 09/14/2021    No results found for: LABCA2  No components found for: [id number]  No results for input(s): INR in the last 168 hours.  No results found for: LABCA2  No results found for: RJW800  No results found for: CAN125  No results found for: CAN153  No results found for: CA2729  No components found for: HGQUANT  No results found for: CEA1, CEA / No results found for: CEA1, CEA   No results found for: AFPTUMOR  No results found for: CHROMOGRNA  No results found for: KPAFRELGTCHN, LAMBDASER, KAPLAMBRATIO (kappa/lambda light chains)  No results found for: HGBA, HGBA2QUANT, HGBFQUANT, HGBSQUAN (Hemoglobinopathy evaluation)   No results found for: LDH  No results found for: IRON, TIBC, IRONPCTSAT (Iron and TIBC)  No results found for: FERRITIN  Urinalysis No results found for: COLORURINE, APPEARANCEUR, LABSPEC, PHURINE, GLUCOSEU, HGBUR, BILIRUBINUR, KETONESUR, PROTEINUR, UROBILINOGEN, NITRITE, LEUKOCYTESUR   STUDIES: MM RT BREAST BX W LOC DEV 1ST LESION IMAGE BX SPEC STEREO GUIDE Addendum Date: 09/05/2024 ADDENDUM REPORT: 09/05/2024 11:03 ADDENDUM: PATHOLOGY revealed: Breast, right, needle core biopsy- FIBROSIS AND DYSTROPHIC CALCIFICATIONS CONSISTENT WITH HEALED FAT NECROSIS. NEGATIVE FOR MALIGNANCY. Pathology results are CONCORDANT with imaging findings, per Dr. Rosina Gelineau. Pathology results and recommendations were discussed with patient via telephone on 09/05/2024 by Mliss Molt RN. Patient reported biopsy site  doing well with no adverse symptoms, and only slight tenderness at the site. Post biopsy care instructions were reviewed, questions were answered and my direct phone number was provided. Patient was instructed to call [facility] for any additional questions or concerns related to biopsy site. RECOMMENDATION: Patient instructed to resume annual bilateral screening mammogram due October 2026. Pathology results reported by Mliss Molt RN 09/05/2024. Electronically Signed   By: Rosina Gelineau M.D.   On: 09/05/2024 11:03   Result Date: 09/05/2024 CLINICAL DATA:  83 year old female with history of right breast cancer status post lumpectomy with Dr. Curvin in 2022 with new early dystrophic probably benign calcifications at the lumpectomy site. Patient declined six-month follow-up and prefers biopsy. EXAM: RIGHT BREAST STEREOTACTIC CORE NEEDLE BIOPSY COMPARISON:  Previous exam(s). FINDINGS: The patient and I discussed the procedure of stereotactic-guided biopsy including benefits and alternatives. We discussed the high likelihood of a successful procedure. We discussed the risks of the procedure including infection, bleeding, tissue injury, clip migration, and inadequate sampling. Informed written consent was given. The usual time out protocol was performed immediately prior to the procedure. Using sterile technique and 1% Lidocaine  with and without epinephrine as local anesthetic, under stereotactic guidance, a 9 gauge vacuum assisted device was used to perform core needle biopsy of early dystrophic calcifications in the lateral upper posterior right breast at site of prior lumpectomy using a MLO approach. Specimen radiograph was performed showing multiple calcifications. Specimens with calcifications are identified for pathology. Lesion quadrant: Upper outer quadrant At the conclusion of the procedure, coil shaped tissue marker clip was deployed into the biopsy cavity. Follow-up 2-view mammogram  was performed and dictated separately. IMPRESSION: Stereotactic-guided biopsy of calcifications at site of prior lumpectomy in the right upper outer breast posterior depth with placement of a coil clip. No apparent complications. Electronically Signed: By: Rosina Gelineau M.D. On: 09/04/2024 11:09   MM CLIP PLACEMENT RIGHT Result Date:  09/04/2024 CLINICAL DATA:  83 year old female status post stereotactic biopsy of calcifications at prior lumpectomy site in the upper outer right breast posterior depth EXAM: 3D DIAGNOSTIC RIGHT MAMMOGRAM POST STEREOTACTIC BIOPSY COMPARISON:  Previous exam(s). ACR Breast Density Category b: There are scattered areas of fibroglandular density. FINDINGS: 3D Mammographic images were obtained following stereotactic guided biopsy of early dystrophic calcifications in the upper outer right breast posterior depth at site of prior lumpectomy. The biopsy marking clip is in expected position at the site of biopsy. IMPRESSION: Appropriate positioning of the coil shaped biopsy marking clip at the site of biopsy in the right upper outer posterior breast. Final Assessment: Post Procedure Mammograms for Marker Placement Electronically Signed   By: Rosina Gelineau M.D.   On: 09/04/2024 11:16   MM DIAG BREAST TOMO BILATERAL Result Date: 09/02/2024 CLINICAL DATA:  83 year old female with history of right breast lumpectomy for ductal carcinoma in February 2022 here for follow up evaluation. EXAM: DIGITAL DIAGNOSTIC BILATERAL MAMMOGRAM WITH TOMOSYNTHESIS AND CAD TECHNIQUE: Bilateral digital diagnostic mammography and breast tomosynthesis was performed. The images were evaluated with computer-aided detection. COMPARISON:  Previous exam(s). ACR Breast Density Category b: There are scattered areas of fibroglandular density. FINDINGS: Right mammogram: New grouped coarse heterogenous calcifications within a fatty/lucent area located within the surgical bed about the upper-outer breast, posterior  third. These calcifications are possibly early dystrophic calcifications; however, given the uncertainty, the patient request stereotactic biopsy for further characterization. Left mammogram: No findings suspicious for malignancy. IMPRESSION: Probably benign right breast calcifications for as above. RECOMMENDATION: Right breast stereotactic biopsy, per the patient's request. I have discussed the findings and recommendations with the patient. If applicable, a reminder letter will be sent to the patient regarding the next appointment. BI-RADS CATEGORY  3: Probably benign. Electronically Signed   By: Curtistine Noble   On: 09/02/2024 10:07     ELIGIBLE FOR AVAILABLE RESEARCH PROTOCOL: no  ASSESSMENT: 83 y.o. [place] woman status post right breast upper outer quadrant biopsy 01/01/2021 for a clinical T1c N0 (invasive) ductal carcinoma, grade not stated, estrogen and progesterone receptor positive, HER-2 not amplified, with an MIB-1 of 15%  (1) s/p right lumpectomy and sentinel node sampling 01/29/2021 for a pT1b pN0, stage IA mucinous invasive ductal carcinoma, grade 2, with negative margins  (2) adjuvant radiation4/28/2022 through 04/07/2021 Site Technique Total Dose (Gy) Dose per Fx (Gy) Completed Fx Beam Energies  Breast, Right: Breast_Rt 3D 42.56/42.56 2.66 16/16 6X  Breast, Right: Breast_Rt_Bst specialPort 8/8 2 4/4 15E    (3) patient decided against genetics testing  (4) anastrozole  started 05/03/2021  (A) bone scan 11/19/2021   PLAN:  Assessment and Plan Assessment & Plan Estrogen receptor positive breast cancer on anastrozole  therapy Estrogen receptor positive breast cancer managed with anastrozole  since June 2022. Tolerating well without significant side effects. Recent biopsy showed fat necrosis, not malignancy. Reassured by findings. - Continue anastrozole  therapy until 2027.  Age-related osteoporosis without current pathological fracture Age-related osteoporosis without  current pathological fracture. Fosamax not tolerated, calcium supplements avoided due to constipation. Emphasized weight-bearing exercises to prevent bone density loss. - Engage in weight-bearing exercises such as walking. - We didn't choose to do another bone density because she doesn't want to do any treatment for osteoporosis.  Total time spent: 30 min.  *Total Encounter Time as defined by the Centers for Medicare and Medicaid Services includes, in addition to the face-to-face time of a patient visit (documented in the note above) non-face-to-face time: obtaining and reviewing outside history,  ordering and reviewing medications, tests or procedures, care coordination (communications with other health care professionals or caregivers) and documentation in the medical record.

## 2024-10-01 ENCOUNTER — Encounter (INDEPENDENT_AMBULATORY_CARE_PROVIDER_SITE_OTHER): Payer: Self-pay | Admitting: *Deleted

## 2024-10-01 DIAGNOSIS — M8589 Other specified disorders of bone density and structure, multiple sites: Secondary | ICD-10-CM | POA: Diagnosis not present

## 2024-10-01 DIAGNOSIS — Z23 Encounter for immunization: Secondary | ICD-10-CM | POA: Diagnosis not present

## 2024-10-01 DIAGNOSIS — Z853 Personal history of malignant neoplasm of breast: Secondary | ICD-10-CM | POA: Diagnosis not present

## 2024-10-01 DIAGNOSIS — Z79899 Other long term (current) drug therapy: Secondary | ICD-10-CM | POA: Diagnosis not present

## 2024-10-01 DIAGNOSIS — E782 Mixed hyperlipidemia: Secondary | ICD-10-CM | POA: Diagnosis not present

## 2024-10-01 DIAGNOSIS — F419 Anxiety disorder, unspecified: Secondary | ICD-10-CM | POA: Diagnosis not present

## 2024-10-01 DIAGNOSIS — Z0000 Encounter for general adult medical examination without abnormal findings: Secondary | ICD-10-CM | POA: Diagnosis not present

## 2024-10-01 DIAGNOSIS — Z1211 Encounter for screening for malignant neoplasm of colon: Secondary | ICD-10-CM | POA: Diagnosis not present

## 2024-10-01 DIAGNOSIS — Z1331 Encounter for screening for depression: Secondary | ICD-10-CM | POA: Diagnosis not present

## 2024-10-01 DIAGNOSIS — I10 Essential (primary) hypertension: Secondary | ICD-10-CM | POA: Diagnosis not present

## 2024-10-16 ENCOUNTER — Encounter (INDEPENDENT_AMBULATORY_CARE_PROVIDER_SITE_OTHER): Payer: Self-pay | Admitting: *Deleted

## 2025-09-19 ENCOUNTER — Inpatient Hospital Stay: Admitting: Hematology and Oncology
